# Patient Record
Sex: Male | Born: 1965 | Race: Black or African American | Hispanic: No | Marital: Married | Smoking: Never smoker
Health system: Southern US, Community
[De-identification: ages and names within clinical notes are randomized; demographics above are authoritative.]

## PROBLEM LIST (undated history)

## (undated) DIAGNOSIS — I10 Essential (primary) hypertension: Secondary | ICD-10-CM

## (undated) DIAGNOSIS — H539 Unspecified visual disturbance: Secondary | ICD-10-CM

## (undated) DIAGNOSIS — N200 Calculus of kidney: Secondary | ICD-10-CM

## (undated) DIAGNOSIS — E7800 Pure hypercholesterolemia, unspecified: Secondary | ICD-10-CM

## (undated) HISTORY — DX: Calculus of kidney: N20.0

## (undated) HISTORY — DX: Unspecified visual disturbance: H53.9

## (undated) HISTORY — DX: Essential (primary) hypertension: I10

## (undated) HISTORY — DX: Pure hypercholesterolemia, unspecified: E78.00

## (undated) HISTORY — PX: FINGER AMPUTATION: SHX636

## (undated) HISTORY — PX: HERNIA REPAIR: SHX51

## (undated) HISTORY — PX: SKIN GRAFT: SHX250

---

## 2015-09-25 ENCOUNTER — Encounter: Payer: Self-pay | Admitting: Gastroenterology

## 2015-10-25 ENCOUNTER — Other Ambulatory Visit (HOSPITAL_COMMUNITY): Payer: Self-pay | Admitting: Internal Medicine

## 2015-10-25 ENCOUNTER — Other Ambulatory Visit: Payer: Self-pay | Admitting: Internal Medicine

## 2015-10-25 DIAGNOSIS — R1011 Right upper quadrant pain: Secondary | ICD-10-CM

## 2015-10-26 ENCOUNTER — Ambulatory Visit (HOSPITAL_COMMUNITY)
Admission: RE | Admit: 2015-10-26 | Discharge: 2015-10-26 | Disposition: A | Discharge status: Home / Self Care | Payer: 59 | Source: Ambulatory Visit | Attending: Internal Medicine | Admitting: Internal Medicine

## 2015-10-26 DIAGNOSIS — R1011 Right upper quadrant pain: Secondary | ICD-10-CM | POA: Insufficient documentation

## 2015-11-13 ENCOUNTER — Ambulatory Visit (AMBULATORY_SURGERY_CENTER): Payer: Self-pay

## 2015-11-13 VITALS — Ht 73.0 in | Wt 257.0 lb

## 2015-11-13 DIAGNOSIS — Z1211 Encounter for screening for malignant neoplasm of colon: Secondary | ICD-10-CM

## 2015-11-13 NOTE — Progress Notes (Signed)
No allergies to eggs or soy No past problems with anesthesia No home oxygen No diet/weight loss meds  Has email and internet; registered for emmi 

## 2015-11-26 ENCOUNTER — Telehealth: Payer: Self-pay | Admitting: Internal Medicine

## 2015-11-26 MED ORDER — SUPREP BOWEL PREP KIT 17.5-3.13-1.6 GM/177ML PO SOLN: ORAL | Status: DC

## 2015-11-26 NOTE — Telephone Encounter (Signed)
Needed bowel prep

## 2015-11-27 ENCOUNTER — Encounter: Payer: Self-pay | Admitting: Gastroenterology

## 2015-11-27 ENCOUNTER — Ambulatory Visit (AMBULATORY_SURGERY_CENTER): Payer: 59 | Admitting: Gastroenterology

## 2015-11-27 VITALS — BP 105/60 | HR 67 | Temp 98.7°F | Resp 15 | Ht 73.0 in | Wt 257.0 lb

## 2015-11-27 DIAGNOSIS — Z1211 Encounter for screening for malignant neoplasm of colon: Secondary | ICD-10-CM

## 2015-11-27 MED ORDER — SODIUM CHLORIDE 0.9 % IV SOLN: 500.0000 mL | INTRAVENOUS | Status: DC

## 2015-11-27 NOTE — Patient Instructions (Signed)
YOU HAD AN ENDOSCOPIC PROCEDURE TODAY AT THE [facility]:   Refer to the procedure report that was given to you for any specific questions about what was found during the examination.  If the procedure report does not answer your questions, please call your gastroenterologist to clarify.  If you requested that your care partner not be given the details of your procedure findings, then the procedure report has been included in a sealed envelope for you to review at your convenience later.  YOU SHOULD EXPECT: Some feelings of bloating in the abdomen. Passage of more gas than usual.  Walking can help get rid of the air that was put into your GI tract during the procedure and reduce the bloating. If you had a lower endoscopy (such as a colonoscopy or flexible sigmoidoscopy) you may notice spotting of blood in your stool or on the toilet paper. If you underwent a bowel prep for your procedure, you may not have a normal bowel movement for a few days.  Please Note:  You might notice some irritation and congestion in your nose or some drainage.  This is from the oxygen used during your procedure.  There is no need for concern and it should clear up in a day or so.  SYMPTOMS TO REPORT IMMEDIATELY:   Following lower endoscopy (colonoscopy or flexible sigmoidoscopy):  Excessive amounts of blood in the stool  Significant tenderness or worsening of abdominal pains  Swelling of the abdomen that is new, acute  Fever of 100F or higher   For urgent or emergent issues, a gastroenterologist can be reached at any hour by calling [phone].   DIET: Your first meal following the procedure should be a small meal and then it is ok to progress to your normal diet. Heavy or fried foods are harder to digest and may make you feel nauseous or bloated.  Likewise, meals heavy in dairy and vegetables can increase bloating.  Drink plenty of fluids but you should avoid alcoholic beverages for 24  hours.  ACTIVITY:  You should plan to take it easy for the rest of today and you should NOT DRIVE or use heavy machinery until tomorrow (because of the sedation medicines used during the test).    FOLLOW UP: Our staff will call the number listed on your records the next business day following your procedure to check on you and address any questions or concerns that you may have regarding the information given to you following your procedure. If we do not reach you, we will leave a message.  However, if you are feeling well and you are not experiencing any problems, there is no need to return our call.  We will assume that you have returned to your regular daily activities without incident.  If any biopsies were taken you will be contacted by phone or by letter within the next 1-3 weeks.  Please call us at [phone] if you have not heard about the biopsies in 3 weeks.    SIGNATURES/CONFIDENTIALITY: You and/or your care partner have signed paperwork which will be entered into your electronic medical record.  These signatures attest to the fact that that the information above on your After Visit Summary has been reviewed and is understood.  Full responsibility of the confidentiality of this discharge information lies with you and/or your care-partner. 

## 2015-11-27 NOTE — Progress Notes (Signed)
Patient awakening,vss,report to rn 

## 2015-11-27 NOTE — Op Note (Signed)
[facility] [address] [place], [id number]   COLONOSCOPY PROCEDURE REPORT  PATIENT: Andrew Dean, [place]  MR#: [id number] BIRTHDATE: 02/04/1966 , 49  yrs. old GENDER: male ENDOSCOPIST: Rachael Feeaniel P Sonali Wivell, MD REFERRED XB:[id number]:Richard Jacky KindleAronson, M.D. PROCEDURE DATE:  11/27/2015 PROCEDURE:   Colonoscopy, screening First Screening Colonoscopy - Avg.  risk and is 50 yrs.  old or older Yes.  Prior Negative Screening - Now for repeat screening. N/A  History of Adenoma - Now for follow-up colonoscopy & has been > or = to 3 yrs.  N/A  Recommend repeat exam, <10 yrs? No ASA CLASS:   Class II INDICATIONS:Screening for colonic neoplasia and Colorectal Neoplasm Risk Assessment for this procedure is average risk. MEDICATIONS: Monitored anesthesia care and Propofol 250 mg IV  DESCRIPTION OF PROCEDURE:   After the risks benefits and alternatives of the procedure were thoroughly explained, informed consent was obtained.  The digital rectal exam revealed no abnormalities of the rectum.   The LB [id number]  endoscope was introduced through the anus and advanced to the cecum, which was identified by both the appendix and ileocecal valve. No adverse events experienced.   The quality of the prep was excellent.  The instrument was then slowly withdrawn as the colon was fully examined. Estimated blood loss is zero unless otherwise noted in this procedure report.   COLON FINDINGS: A normal appearing cecum, ileocecal valve, and appendiceal orifice were identified.  The ascending, transverse, descending, sigmoid colon, and rectum appeared unremarkable. Retroflexed views revealed no abnormalities. The time to cecum = 1.3 Withdrawal time = 6.5   The scope was withdrawn and the procedure completed. COMPLICATIONS: There were no immediate complications.  ENDOSCOPIC IMPRESSION: Normal colonoscopy  RECOMMENDATIONS: You should continue to follow colorectal cancer screening guidelines for  "routine risk" patients with a repeat colonoscopy in 10 years. There is no need for other screening tests prior to then (including FOBT, stool testing).  eSigned:  Rachael Feeaniel P Lilianna Case, MD 11/27/2015 9:30 AM

## 2015-11-28 ENCOUNTER — Telehealth: Payer: Self-pay | Admitting: *Deleted

## 2015-11-28 NOTE — Telephone Encounter (Signed)
  Follow up Call-  Call back number 11/27/2015  Post procedure Call Back phone  # [phone]  Permission to leave phone message Yes     Patient questions:  Do you have a fever, pain , or abdominal swelling? No. Pain Score  0 *  Have you tolerated food without any problems? Yes.    Have you been able to return to your normal activities? Yes.    Do you have any questions about your discharge instructions: Diet   No. Medications  No. Follow up visit  No.  Do you have questions or concerns about your Care? No.  Actions: * If pain score is 4 or above: No action needed, pain <4.

## 2017-04-15 IMAGING — US US ABDOMEN LIMITED
1 series · 14 of 25 positions shown · non-contrast
Comparison: None.

CLINICAL DATA: One month history of right upper quadrant pain

EXAM:
US ABDOMEN LIMITED - RIGHT UPPER QUADRANT

[Series 1: us abdomen limited · 0.21mm/px · 14 of 65 slices shown]
[im 1/65]
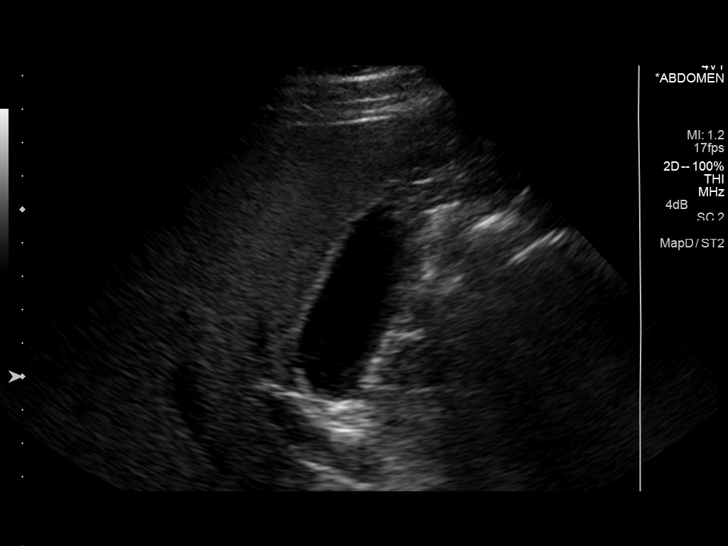
[im 6/65]
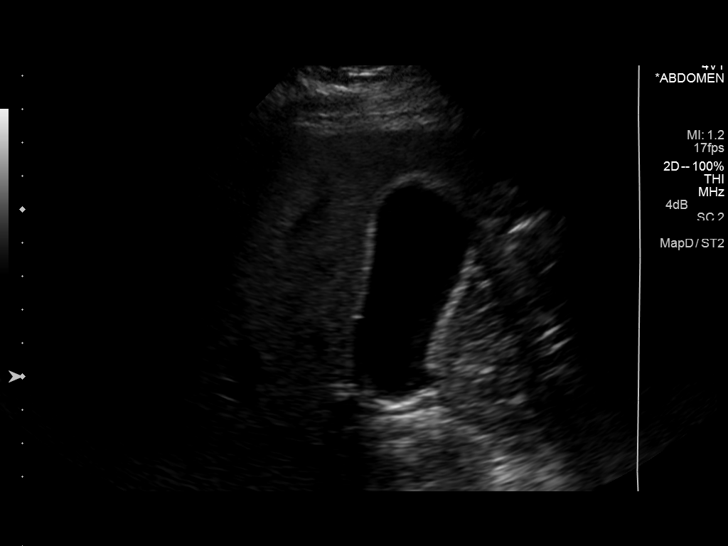
[im 11/65]
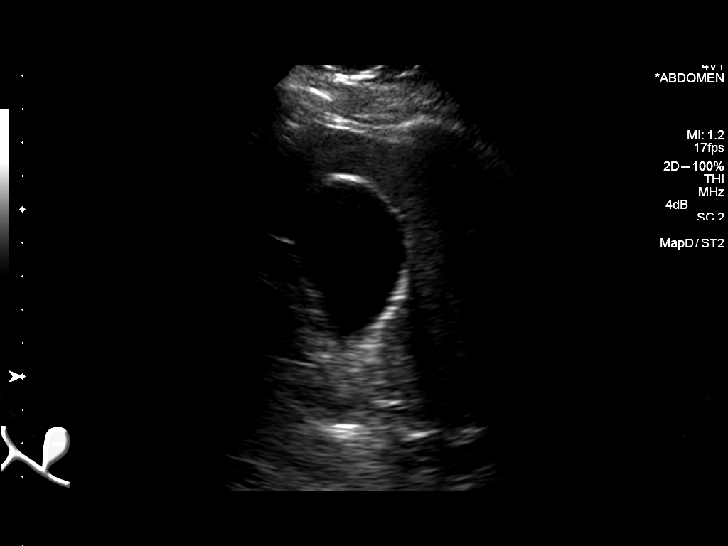
[im 17/65]
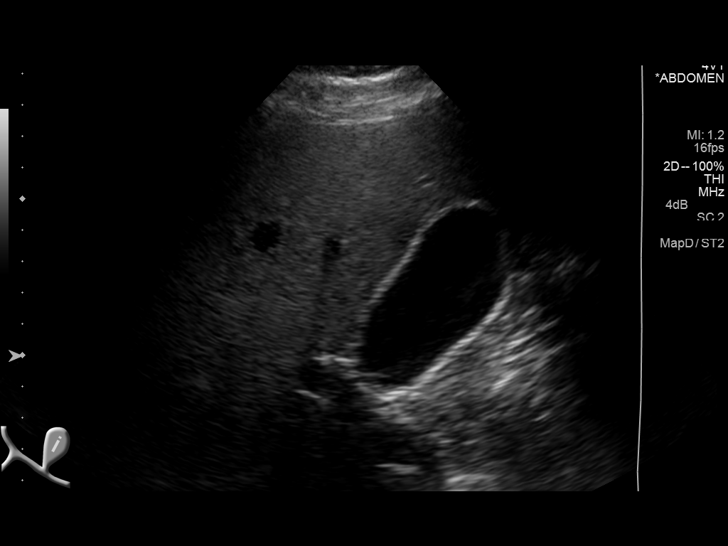
[im 22/65]
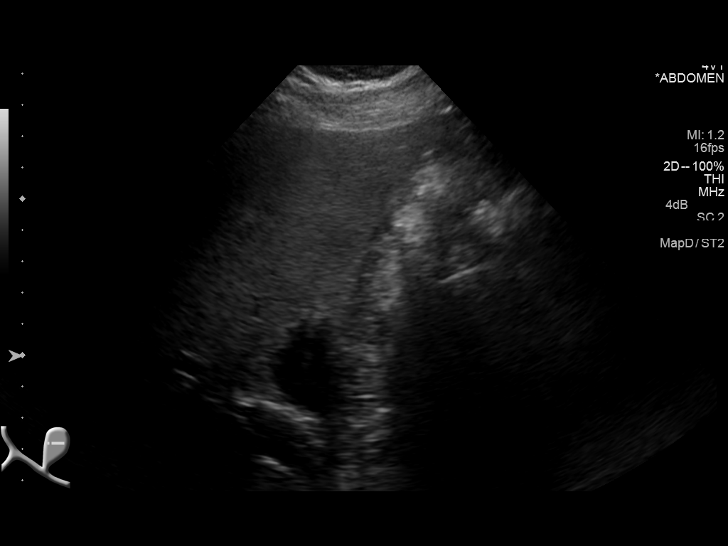
[im 25/65]
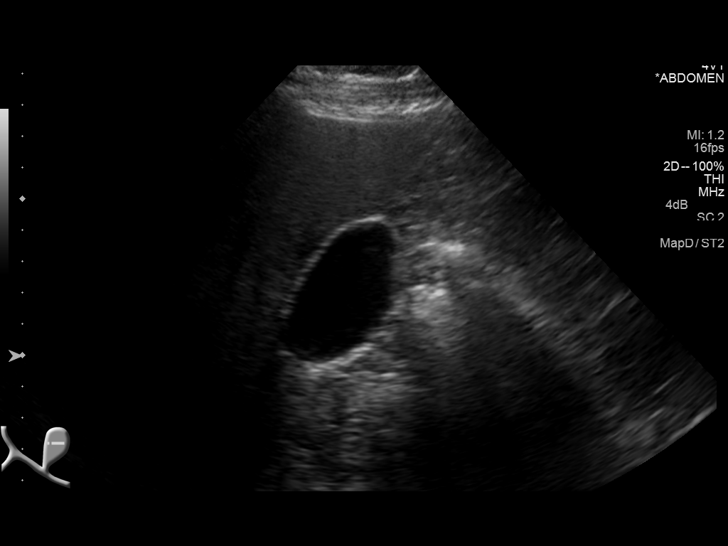
[im 30/65]
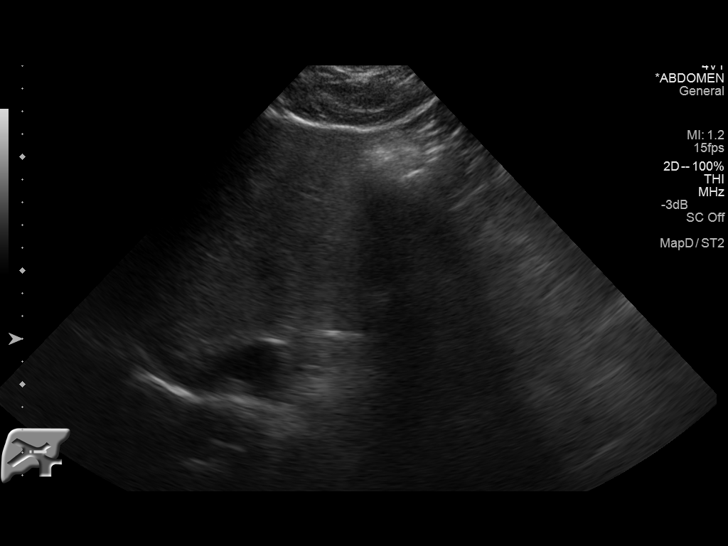
[im 35/65]
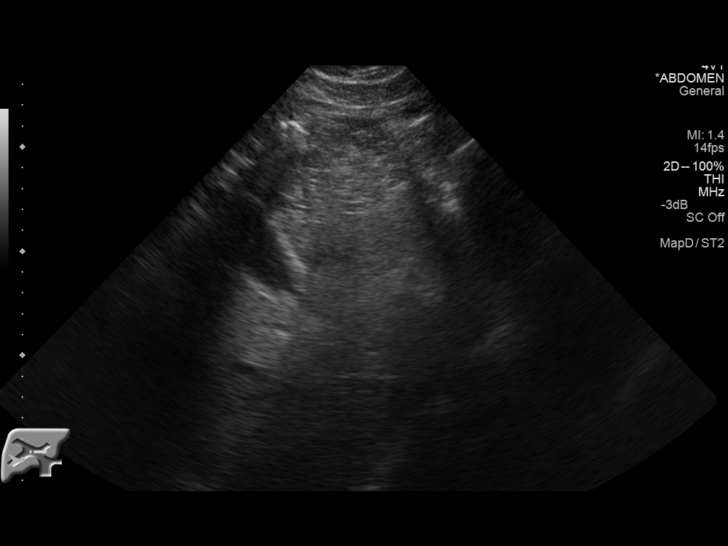
[im 41/65]
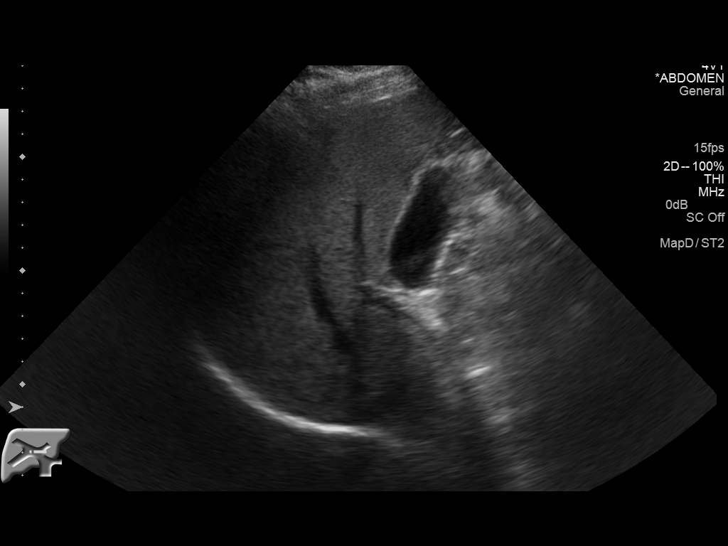
[im 43/65]
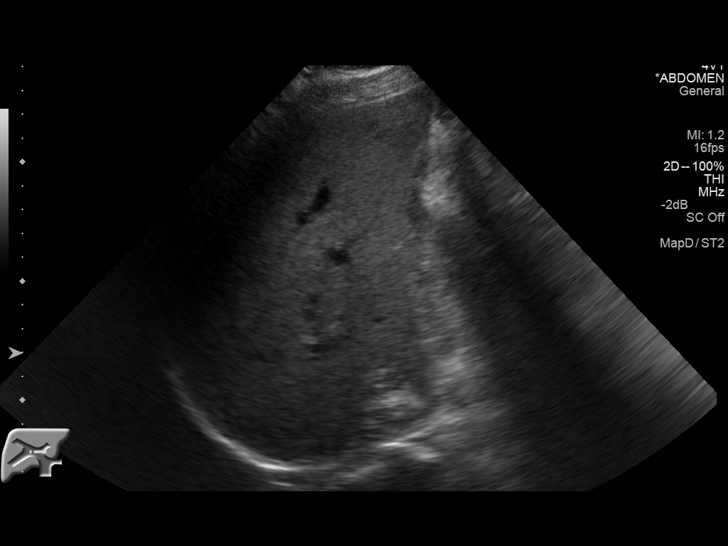
[im 49/65]
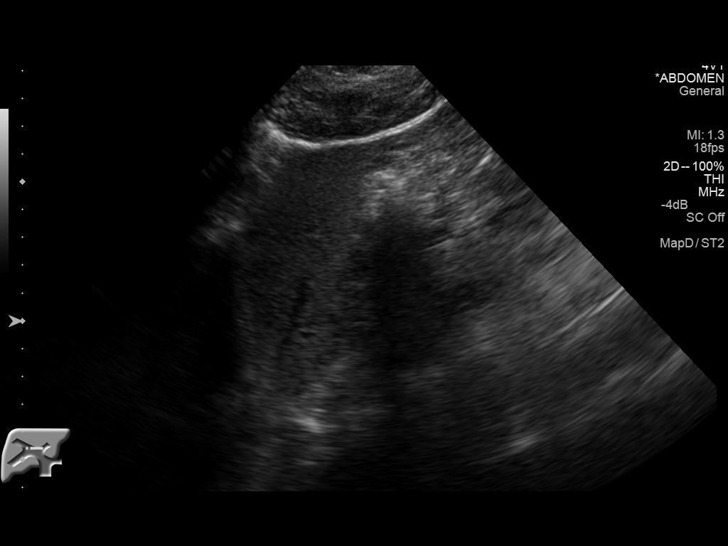
[im 54/65]
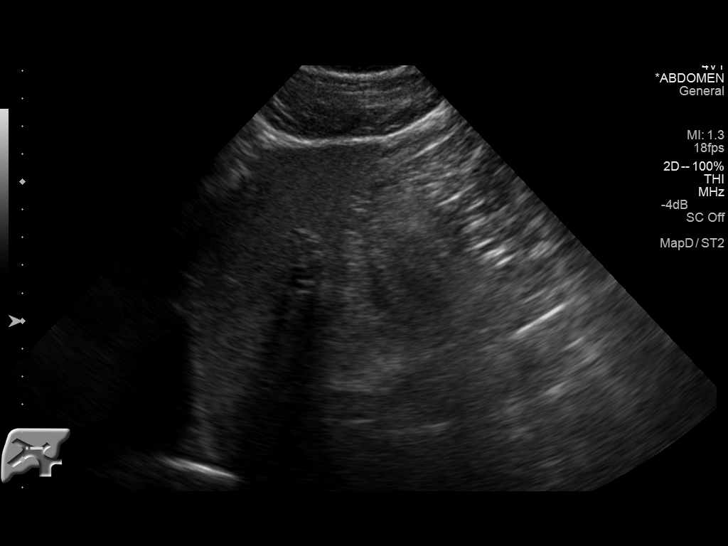
[im 59/65]
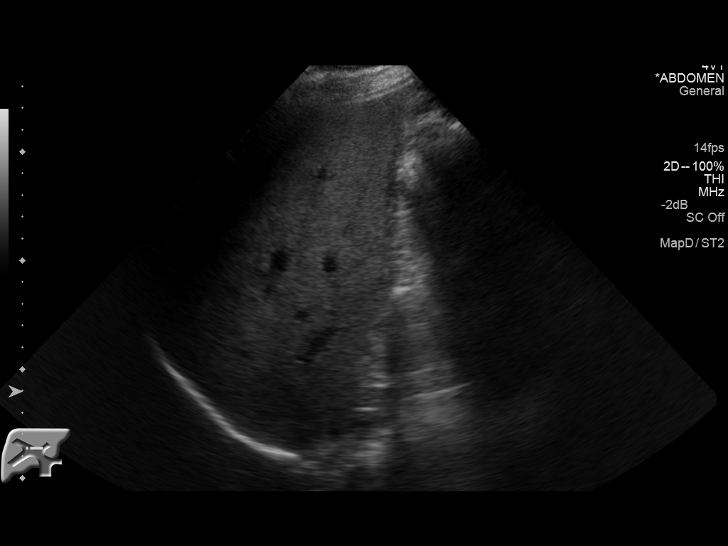
[im 65/65]
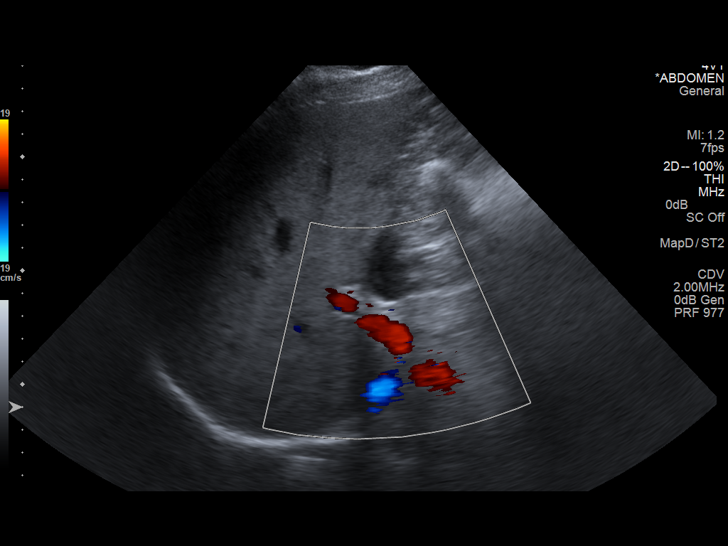

[14 of 25 positions shown; findings below may reference images not displayed]

FINDINGS: Gallbladder:

No gallstones or wall thickening visualized. There is no
pericholecystic fluid. No sonographic Murphy sign noted.

Common bile duct:

Diameter: 2 mm. There is no intrahepatic or extrahepatic biliary
duct dilatation.

Liver:

No focal lesion identified. Within normal limits in parenchymal
echogenicity.
IMPRESSION: Study within normal limits.

## 2017-04-17 DIAGNOSIS — E784 Other hyperlipidemia: Secondary | ICD-10-CM | POA: Diagnosis not present

## 2017-04-17 DIAGNOSIS — I10 Essential (primary) hypertension: Secondary | ICD-10-CM | POA: Diagnosis not present

## 2017-05-19 DIAGNOSIS — M25579 Pain in unspecified ankle and joints of unspecified foot: Secondary | ICD-10-CM | POA: Diagnosis not present

## 2017-05-19 DIAGNOSIS — M7661 Achilles tendinitis, right leg: Secondary | ICD-10-CM | POA: Diagnosis not present

## 2017-06-04 DIAGNOSIS — M7661 Achilles tendinitis, right leg: Secondary | ICD-10-CM | POA: Diagnosis not present

## 2017-10-14 DIAGNOSIS — Z0000 Encounter for general adult medical examination without abnormal findings: Secondary | ICD-10-CM | POA: Diagnosis not present

## 2017-10-21 DIAGNOSIS — Z0000 Encounter for general adult medical examination without abnormal findings: Secondary | ICD-10-CM | POA: Diagnosis not present

## 2017-10-21 DIAGNOSIS — Z23 Encounter for immunization: Secondary | ICD-10-CM | POA: Diagnosis not present

## 2017-10-21 DIAGNOSIS — E7849 Other hyperlipidemia: Secondary | ICD-10-CM | POA: Diagnosis not present

## 2017-10-21 DIAGNOSIS — G542 Cervical root disorders, not elsewhere classified: Secondary | ICD-10-CM | POA: Diagnosis not present

## 2018-06-30 DIAGNOSIS — G542 Cervical root disorders, not elsewhere classified: Secondary | ICD-10-CM | POA: Diagnosis not present

## 2018-06-30 DIAGNOSIS — E7849 Other hyperlipidemia: Secondary | ICD-10-CM | POA: Diagnosis not present

## 2018-06-30 DIAGNOSIS — I10 Essential (primary) hypertension: Secondary | ICD-10-CM | POA: Diagnosis not present

## 2018-08-02 DIAGNOSIS — R200 Anesthesia of skin: Secondary | ICD-10-CM | POA: Diagnosis not present

## 2018-08-02 DIAGNOSIS — R202 Paresthesia of skin: Secondary | ICD-10-CM | POA: Diagnosis not present

## 2018-08-02 DIAGNOSIS — R292 Abnormal reflex: Secondary | ICD-10-CM | POA: Diagnosis not present

## 2018-08-12 ENCOUNTER — Telehealth: Payer: Self-pay | Admitting: Neurology

## 2018-08-12 ENCOUNTER — Encounter: Payer: Self-pay | Admitting: Neurology

## 2018-08-12 ENCOUNTER — Ambulatory Visit: Payer: 59 | Admitting: Neurology

## 2018-08-12 ENCOUNTER — Other Ambulatory Visit: Payer: Self-pay

## 2018-08-12 VITALS — BP 139/86 | HR 69 | Resp 18 | Ht 73.0 in | Wt 258.0 lb

## 2018-08-12 DIAGNOSIS — R29898 Other symptoms and signs involving the musculoskeletal system: Secondary | ICD-10-CM | POA: Diagnosis not present

## 2018-08-12 DIAGNOSIS — G959 Disease of spinal cord, unspecified: Secondary | ICD-10-CM

## 2018-08-12 DIAGNOSIS — M5440 Lumbago with sciatica, unspecified side: Secondary | ICD-10-CM

## 2018-08-12 DIAGNOSIS — R29818 Other symptoms and signs involving the nervous system: Secondary | ICD-10-CM

## 2018-08-12 DIAGNOSIS — R200 Anesthesia of skin: Secondary | ICD-10-CM | POA: Diagnosis not present

## 2018-08-12 NOTE — Progress Notes (Signed)
[facility]  PATIENT: Andrew Dean DOB: 06-05-1966  REFERRING DOCTOR OR PCP: Geoffry Paradiseichard Aronson SOURCE: Patient, notes from Dr. Jacky KindleAronson, laboratory reports  _________________________________   HISTORICAL  CHIEF COMPLAINT:  Chief Complaint  Patient presents with  . Numbness    Intermittent left sided neck pain for yrs.  Numbness bilat index, middle and ring fingers onset 3-4 wks. ago.  Also onset 3-4 weeks ago of lbp radiating down outside of both legs to toes, only when he sits up straight or leans his head backwards/fim    HISTORY OF PRESENT ILLNESS:  I had the pleasure seeing your patient, Andrew ManHarry Bertha, [facility] for neurologic consultation regarding his numbness and hyperreflexia.  About 3-4 weeks ago, he was coughing a lot at night (mucus stuck in throat) .  When he woke up he had a lot of numbness in his arms, trunk  and legs.   He sat on the side of the bed.  There was no weakness or facial numbness .  Over a few minutes the numbness in the legs and trunk improved but he still had numbness in his hands.     He got out of bed and gait was normal.     Since then, he has had numbness in the fingertips.  He notes no change in coordination, writing or typing.      About 2 months ago, he had aching in the thenar eminences of both hands and this would sometimes wake him up at night.     He has had ED the last 4 months and Viagra was started but has not helped.    Over the last 2 months, he has noted mild urinary frequency and urgency and had urge incontinence (leakage).    He does not note any change in gait, stair climbing and coordination.      He does not have neck pain the last 2 months but has had neck pain and stiffness hte past 2 years.    He notes a Lhermitte sign with far extension of the neck.    Hee also reports back pain and sometimes pain going into the legs as an electric shock.  This is increased recently.     Laboratory  reports were reviewed.  CBC and TSH were essentially normal.    He has benign essential hypertension and hyperlipidemia and is otherwise healthy.   No h/o cancer.    No preceding viral illness or vaccination.     REVIEW OF SYSTEMS: Constitutional: No fevers, chills, sweats, or change in appetite Eyes: No visual changes, double vision, eye pain Ear, nose and throat: No hearing loss, ear pain, nasal congestion, sore throat Cardiovascular: No chest pain, palpitations Respiratory: No shortness of breath at rest or with exertion.   No wheezes GastrointestinaI: No nausea, vomiting, diarrhea, abdominal pain, fecal incontinence Genitourinary: recent urgency and rare incontinence.   Nocturia x 2.   Musculoskeletal: No neck pain, back pain Integumentary: No rash, pruritus, skin lesions Neurological: as above Psychiatric: No depression at this time.  No anxiety Endocrine: No palpitations, diaphoresis, change in appetite, change in weigh or increased thirst Hematologic/Lymphatic: No anemia, purpura, petechiae. Allergic/Immunologic: No itchy/runny eyes, nasal congestion, recent allergic reactions, rashes  ALLERGIES: No Known Allergies  HOME MEDICATIONS:  Current Outpatient Medications:  .  atorvastatin (LIPITOR) 20 MG tablet, Take 20 mg by mouth daily., Disp: , Rfl:  .  losartan (COZAAR) 50 MG tablet, Take 50 mg by mouth daily., Disp: , Rfl:  .  meloxicam (MOBIC) 15 MG tablet, TK 1 T PO QD WF FOR INFLAMMATION, Disp: , Rfl: 0 .  VIAGRA 100 MG tablet, TK 1/2 TO 1 T PO 1 HOUR PRIOR TO SEXUAL ACTIVITY, Disp: , Rfl: 11  PAST MEDICAL HISTORY: Past Medical History:  Diagnosis Date  . High cholesterol   . Hypertension   . Hypertension   . Vision abnormalities     PAST SURGICAL HISTORY: Past Surgical History:  Procedure Laterality Date  . FINGER AMPUTATION     middle amputation  . HERNIA REPAIR    . SKIN GRAFT      FAMILY HISTORY: Family History  Problem Relation Age of Onset  .  Pancreatic cancer Mother   . Hypertension Father   . Diabetes type II Father   . Healthy Sister   . Healthy Sister   . Healthy Sister   . Colon cancer Neg Hx     SOCIAL HISTORY:  Social History   Socioeconomic History  . Marital status: Married    Spouse name: Not on file  . Number of children: Not on file  . Years of education: Not on file  . Highest education level: Not on file  Occupational History  . Not on file  Social Needs  . Financial resource strain: Not on file  . Food insecurity:    Worry: Not on file    Inability: Not on file  . Transportation needs:    Medical: Not on file    Non-medical: Not on file  Tobacco Use  . Smoking status: Never Smoker  . Smokeless tobacco: Never Used  Substance and Sexual Activity  . Alcohol use: Yes    Alcohol/week: 3.0 standard drinks    Types: 3 Shots of liquor per week  . Drug use: No  . Sexual activity: Not on file  Lifestyle  . Physical activity:    Days per week: Not on file    Minutes per session: Not on file  . Stress: Not on file  Relationships  . Social connections:    Talks on phone: Not on file    Gets together: Not on file    Attends religious service: Not on file    Active member of club or organization: Not on file    Attends meetings of clubs or organizations: Not on file    Relationship status: Not on file  . Intimate partner violence:    Fear of current or ex partner: Not on file    Emotionally abused: Not on file    Physically abused: Not on file    Forced sexual activity: Not on file  Other Topics Concern  . Not on file  Social History Narrative  . Not on file     PHYSICAL EXAM  Vitals:   08/12/18 1037  BP: 139/86  Pulse: 69  Resp: 18  Weight: 258 lb (117 kg)  Height: 6\' 1"  (1.854 m)    Body mass index is 34.04 kg/m.   General: The patient is well-developed and well-nourished and in no acute distress  Neck: The neck is supple, no carotid bruits are noted.  The neck is  nontender.  Cardiovascular: The heart has a regular rate and rhythm with a normal S1 and S2. There were no murmurs, gallops or rubs.   Skin: Extremities are without rash or edema.  Musculoskeletal:  Back is nontender  Neurologic Exam  Mental status: The patient is alert and oriented x 3 at the time of the examination. The  patient has apparent normal recent and remote memory, with an apparently normal attention span and concentration ability.   Speech is normal.  Cranial nerves: Extraocular movements are full.  Facial symmetry is present. There is good facial sensation to soft touch bilaterally.Facial strength is normal.  Trapezius and sternocleidomastoid strength is normal. No dysarthria is noted.  The tongue is midline, and the patient has symmetric elevation of the soft palate. No obvious hearing deficits are noted.  Motor:  Muscle bulk is normal.   Tone is normal. Strength is 4+/5 in the intrinsic hand muscles.  Strength is normal elsewhere.  Sensory: Sensory testing is intact to pinprick, soft touch and vibration sensation in all 4 extremities.  Coordination: Cerebellar testing reveals good finger-nose-finger and heel-to-shin bilaterally.  Gait and station: Station is normal.   Gait is normal. Tandem gait is normal. Romberg is negative.   Reflexes: Deep tendon reflexes are 2 at the biceps, 3 at the triceps, right greater than left, 4 at the knees with crossed abductor responses.  There is some nonsustained clonus on the right and an increased ankle reflex on the left without clonus.   Plantar responses are flexor.     ASSESSMENT AND PLAN  Cervical myelopathy (HCC)  Numbness  Lhermitte's sign positive  Hand weakness  Disease of spinal cord (HCC) - Plan: MR CERVICAL SPINE W WO CONTRAST  Midline low back pain with sciatica, sciatica laterality unspecified, unspecified chronicity   In summary, Mr. Creta Levin is a 52 year old man with numbness, hand weakness,Lhermitte sign,  urinary urgency, ED and back pain.  Sometimes he gets an electric shock going down his back and even into the legs.     He has hyperreflexia and weakness of the intrinsic hand muscles on examination.  I am most concerned that he has a cervical myelopathy if there is an intrinsic transverse myelitis or an extrinsic compression from disc or degenerative disease.   We need to check an MRI of the cervical spine with and without contrast.   If he does have a extrinsic compressive myelopathy, I will have him see neurosurgery for further evaluation and possible surgery.     Has some back pain and he has some radiating electric like dysesthesias.   We will check an MRI of the lumbar spine without contrast.  Currently, he does not feel that the dysesthetic pain is severe enough to warrant going on a medication such as gabapentin.  We also discussed treatment for the urinary urgency and we will consider going on the medication if this worsens.  We will call him with the results of the MRIs and follow-up as needed based on the results.  He should call us if he has new or worsening neurologic symptoms.    Quy Lotts A. Epimenio Foot, MD, [facility] 08/12/2018, 10:41 AM Certified in Neurology, Clinical Neurophysiology, Sleep Medicine, Pain Medicine and Neuroimaging  [facility] [address] [place], [place] [address] [phone]

## 2018-08-12 NOTE — Telephone Encounter (Signed)
Dr. Epimenio FootSater came to me today about scheduling this patient's MRI within the next few days. [facility] and [facility] pre [facility] requirements, no pre cert is required. I called GI and spoke with Colin MuldersBrianna, she has openings this weekend and is going to call and schedule the patient now.

## 2018-08-12 NOTE — Telephone Encounter (Signed)
Pt. did c/o persistent lower back pain, minimal relief with otc meds, Meloxicam. Per V/O RAS, ok for MRI L-spine without contrast.  Order placed in Epic/fim

## 2018-08-12 NOTE — Telephone Encounter (Signed)
I called the patient to make him aware that we placed the order for the second MRI and he could call GI back and schedule. DW

## 2018-08-12 NOTE — Telephone Encounter (Signed)
Colin MuldersBrianna called back and stated that the patient also wanted to have a Lumbar MRI at the same time and would like to know if we could put this in. Please advise.

## 2018-08-16 ENCOUNTER — Telehealth: Payer: Self-pay | Admitting: Neurology

## 2018-08-16 NOTE — Telephone Encounter (Signed)
[employer] Auth: [employer] via [employer] website.  Patient is scheduled at GI for 08/24/18.

## 2018-08-24 ENCOUNTER — Inpatient Hospital Stay: Admission: RE | Admit: 2018-08-24 | Payer: 59 | Source: Ambulatory Visit

## 2018-09-19 ENCOUNTER — Ambulatory Visit
Admission: RE | Admit: 2018-09-19 | Discharge: 2018-09-19 | Disposition: A | Discharge status: Home / Self Care | Payer: 59 | Source: Ambulatory Visit | Attending: Neurology | Admitting: Neurology

## 2018-09-19 DIAGNOSIS — M545 Low back pain: Secondary | ICD-10-CM | POA: Diagnosis not present

## 2018-09-19 DIAGNOSIS — G959 Disease of spinal cord, unspecified: Secondary | ICD-10-CM

## 2018-09-19 DIAGNOSIS — M5440 Lumbago with sciatica, unspecified side: Secondary | ICD-10-CM | POA: Diagnosis not present

## 2018-09-19 MED ORDER — GADOBENATE DIMEGLUMINE 529 MG/ML IV SOLN
20.0000 mL | Freq: Once | INTRAVENOUS | Status: AC | PRN
  Administered 2018-09-19: 20 mL via INTRAVENOUS

## 2018-09-20 ENCOUNTER — Telehealth: Payer: Self-pay | Admitting: Neurology

## 2018-09-20 DIAGNOSIS — G959 Disease of spinal cord, unspecified: Secondary | ICD-10-CM

## 2018-09-20 DIAGNOSIS — M48061 Spinal stenosis, lumbar region without neurogenic claudication: Secondary | ICD-10-CM

## 2018-09-20 DIAGNOSIS — R29818 Other symptoms and signs involving the nervous system: Secondary | ICD-10-CM

## 2018-09-20 NOTE — Telephone Encounter (Signed)
I spoke with Andrew Dean about the cervical and lumbar MRIs.  They are both very abnormal.  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  There are myelopathic signal changes within the spinal cord.  There could be C5 nerve root compression on the left.  In the lumbar spine, he also has severe spinal stenosis at L4-L5 due to mild degenerative changes and epidural lipoomatosis and at L5-S1 due to a central disc herniation and epidural lipomatosis could also affect the S1 nerve roots.  Due to these changes, I would like him to see neurosurgery and we will set up an appointment for him.

## 2018-09-29 DIAGNOSIS — M50221 Other cervical disc displacement at C4-C5 level: Secondary | ICD-10-CM | POA: Diagnosis not present

## 2018-10-06 DIAGNOSIS — M5020 Other cervical disc displacement, unspecified cervical region: Secondary | ICD-10-CM | POA: Diagnosis not present

## 2018-10-06 DIAGNOSIS — M50021 Cervical disc disorder at C4-C5 level with myelopathy: Secondary | ICD-10-CM | POA: Diagnosis not present

## 2018-10-06 DIAGNOSIS — M4802 Spinal stenosis, cervical region: Secondary | ICD-10-CM | POA: Diagnosis not present

## 2018-10-28 DIAGNOSIS — R82998 Other abnormal findings in urine: Secondary | ICD-10-CM | POA: Diagnosis not present

## 2018-10-28 DIAGNOSIS — E7849 Other hyperlipidemia: Secondary | ICD-10-CM | POA: Diagnosis not present

## 2018-10-28 DIAGNOSIS — Z125 Encounter for screening for malignant neoplasm of prostate: Secondary | ICD-10-CM | POA: Diagnosis not present

## 2018-11-04 DIAGNOSIS — E7849 Other hyperlipidemia: Secondary | ICD-10-CM | POA: Diagnosis not present

## 2018-11-04 DIAGNOSIS — Z0000 Encounter for general adult medical examination without abnormal findings: Secondary | ICD-10-CM | POA: Diagnosis not present

## 2018-11-04 DIAGNOSIS — Z23 Encounter for immunization: Secondary | ICD-10-CM | POA: Diagnosis not present

## 2018-11-04 DIAGNOSIS — I10 Essential (primary) hypertension: Secondary | ICD-10-CM | POA: Diagnosis not present

## 2018-11-04 DIAGNOSIS — R7301 Impaired fasting glucose: Secondary | ICD-10-CM | POA: Diagnosis not present

## 2018-11-10 DIAGNOSIS — M50221 Other cervical disc displacement at C4-C5 level: Secondary | ICD-10-CM | POA: Diagnosis not present

## 2018-12-08 DIAGNOSIS — M50221 Other cervical disc displacement at C4-C5 level: Secondary | ICD-10-CM | POA: Diagnosis not present

## 2021-08-14 ENCOUNTER — Other Ambulatory Visit: Payer: Self-pay | Admitting: Internal Medicine

## 2021-08-14 ENCOUNTER — Ambulatory Visit
Admission: RE | Admit: 2021-08-14 | Discharge: 2021-08-14 | Disposition: A | Discharge status: Home / Self Care | Payer: 59 | Source: Ambulatory Visit | Attending: Internal Medicine | Admitting: Internal Medicine

## 2021-08-14 DIAGNOSIS — R109 Unspecified abdominal pain: Secondary | ICD-10-CM

## 2021-08-14 DIAGNOSIS — R319 Hematuria, unspecified: Secondary | ICD-10-CM

## 2022-12-31 ENCOUNTER — Emergency Department (HOSPITAL_COMMUNITY): Payer: 59

## 2022-12-31 ENCOUNTER — Ambulatory Visit (INDEPENDENT_AMBULATORY_CARE_PROVIDER_SITE_OTHER): Payer: 59

## 2022-12-31 ENCOUNTER — Ambulatory Visit: Payer: 59 | Admitting: Podiatry

## 2022-12-31 ENCOUNTER — Emergency Department (HOSPITAL_COMMUNITY)
Admission: EM | Admit: 2022-12-31 | Discharge: 2023-01-01 | Disposition: A | Discharge status: Home / Self Care | Payer: 59 | Attending: Emergency Medicine | Admitting: Emergency Medicine

## 2022-12-31 ENCOUNTER — Other Ambulatory Visit: Payer: Self-pay

## 2022-12-31 ENCOUNTER — Encounter (HOSPITAL_COMMUNITY): Payer: Self-pay

## 2022-12-31 VITALS — BP 167/109 | HR 69

## 2022-12-31 DIAGNOSIS — M778 Other enthesopathies, not elsewhere classified: Secondary | ICD-10-CM

## 2022-12-31 DIAGNOSIS — M79671 Pain in right foot: Secondary | ICD-10-CM

## 2022-12-31 DIAGNOSIS — M79672 Pain in left foot: Secondary | ICD-10-CM | POA: Diagnosis not present

## 2022-12-31 DIAGNOSIS — Z79899 Other long term (current) drug therapy: Secondary | ICD-10-CM | POA: Diagnosis not present

## 2022-12-31 DIAGNOSIS — N201 Calculus of ureter: Secondary | ICD-10-CM

## 2022-12-31 DIAGNOSIS — I10 Essential (primary) hypertension: Secondary | ICD-10-CM | POA: Diagnosis not present

## 2022-12-31 DIAGNOSIS — R1012 Left upper quadrant pain: Secondary | ICD-10-CM | POA: Diagnosis present

## 2022-12-31 DIAGNOSIS — N132 Hydronephrosis with renal and ureteral calculous obstruction: Secondary | ICD-10-CM | POA: Insufficient documentation

## 2022-12-31 LAB — COMPREHENSIVE METABOLIC PANEL
ALT: 32 U/L (ref 0–44)
AST: 30 U/L (ref 15–41)
Albumin: 4.1 g/dL (ref 3.5–5.0)
Alkaline Phosphatase: 45 U/L (ref 38–126)
Anion gap: 9 (ref 5–15)
BUN: 19 mg/dL (ref 6–20)
CO2: 25 mmol/L (ref 22–32)
Calcium: 8.8 mg/dL — ABNORMAL LOW (ref 8.9–10.3)
Chloride: 102 mmol/L (ref 98–111)
Creatinine, Ser: 1.42 mg/dL — ABNORMAL HIGH (ref 0.61–1.24)
GFR, Estimated: 57 mL/min — ABNORMAL LOW (ref >60)
Glucose, Bld: 112 mg/dL — ABNORMAL HIGH (ref 70–99)
Potassium: 3.7 mmol/L (ref 3.5–5.1)
Sodium: 136 mmol/L (ref 135–145)
Total Bilirubin: 0.9 mg/dL (ref 0.3–1.2)
Total Protein: 7.3 g/dL (ref 6.5–8.1)

## 2022-12-31 LAB — URINALYSIS, ROUTINE W REFLEX MICROSCOPIC
Bacteria, UA: NONE SEEN
Bilirubin Urine: NEGATIVE
Glucose, UA: NEGATIVE mg/dL
Ketones, ur: NEGATIVE mg/dL
Leukocytes,Ua: NEGATIVE
Nitrite: NEGATIVE
Protein, ur: NEGATIVE mg/dL
Specific Gravity, Urine: 1.02 (ref 1.005–1.030)
pH: 5 (ref 5.0–8.0)

## 2022-12-31 LAB — CBC WITH DIFFERENTIAL/PLATELET
Abs Immature Granulocytes: 0.03 10*3/uL (ref 0.00–0.07)
Basophils Absolute: 0 10*3/uL (ref 0.0–0.1)
Basophils Relative: 0 %
Eosinophils Absolute: 0.1 10*3/uL (ref 0.0–0.5)
Eosinophils Relative: 1 %
HCT: 40.7 % (ref 39.0–52.0)
Hemoglobin: 14.5 g/dL (ref 13.0–17.0)
Immature Granulocytes: 0 %
Lymphocytes Relative: 16 %
Lymphs Abs: 1.2 10*3/uL (ref 0.7–4.0)
MCH: 28 pg (ref 26.0–34.0)
MCHC: 35.6 g/dL (ref 30.0–36.0)
MCV: 78.6 fL — ABNORMAL LOW (ref 80.0–100.0)
Monocytes Absolute: 0.6 10*3/uL (ref 0.1–1.0)
Monocytes Relative: 7 %
Neutro Abs: 5.6 10*3/uL (ref 1.7–7.7)
Neutrophils Relative %: 76 %
Platelets: 186 10*3/uL (ref 150–400)
RBC: 5.18 MIL/uL (ref 4.22–5.81)
RDW: 13 % (ref 11.5–15.5)
WBC: 7.5 10*3/uL (ref 4.0–10.5)
nRBC: 0 % (ref 0.0–0.2)

## 2022-12-31 LAB — LIPASE, BLOOD: Lipase: 36 U/L (ref 11–51)

## 2022-12-31 MED ORDER — MELOXICAM 15 MG PO TABS: 15.0000 mg | ORAL_TABLET | Freq: Every day | ORAL | 1 refills | Status: DC

## 2022-12-31 MED ORDER — IOHEXOL 300 MG/ML  SOLN
100.0000 mL | Freq: Once | INTRAMUSCULAR | Status: AC | PRN
  Administered 2022-12-31: 100 mL via INTRAVENOUS

## 2022-12-31 NOTE — Progress Notes (Signed)
   Chief Complaint  Patient presents with   Foot Pain    Bilateral foot pain, possible plantar fasciitis, heel and lateral side of the foot, X-Rays done today    HPI: 57 y.o. male presenting today as a new patient for evaluation of intermittent bilateral foot pain to the lateral aspect of the bilateral feet.  Patient states that this has been off and on for several months.  Denies a history of injury.  He does take ibuprofen as needed.  He has tried different work boots with minimal improvement.  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  He says that this week he has no pain or tenderness to his feet  Past Medical History:  Diagnosis Date   High cholesterol    Hypertension    Hypertension    Vision abnormalities     Past Surgical History:  Procedure Laterality Date   FINGER AMPUTATION     middle amputation   HERNIA REPAIR     SKIN GRAFT      No Known Allergies   Physical Exam: General: The patient is alert and oriented x3 in no acute distress.  Dermatology: Skin is warm, dry and supple bilateral lower extremities. Negative for open lesions or macerations.  Vascular: Palpable pedal pulses bilaterally. Capillary refill within normal limits.  Negative for any significant edema or erythema  Neurological: Light touch and protective threshold grossly intact  Musculoskeletal Exam: No pedal deformities noted  Radiographic Exam B/L feet 12/31/2022:  Normal osseous mineralization. Joint spaces preserved. No fracture/dislocation/boney destruction.  Os peroneum noted as well as additional accessory ossicles throughout the foot.  Assessment: 1.  Capsulitis bilateral feet; currently asymptomatic.  Intermittent. -Patient evaluated.  X-rays reviewed -Prescription for meloxicam 15 mg daily to take for acute flareups -Appointment with orthotics department for custom molded orthotics to support the medial longitudinal arch of the foot and offload pressure from the  midfoot -Return to clinic as needed with me      Edrick Kins, DPM [facility]  Dr. Edrick Kins, DPM    [address], [place] [address]                Office [phone]  Fax [phone]

## 2022-12-31 NOTE — ED Provider Triage Note (Signed)
Emergency Medicine Provider Triage Evaluation Note  Opie Maclaughlin , a 57 y.o. male  was evaluated in triage.  Pt complains of flank pain and abdominal pain. Patient reports he has had 3 episodes of left flank pain that radiates to his lower abdomen with associated nausea, vomiting since New Year's Eve.  Today he also developed chest pain that started around 7 PM.  No fever, bowel changes, urinary symptoms.  Review of Systems  Positive: As above Negative: As above  Physical Exam  BP (!) 178/102   Pulse 79   Temp 98.4 F (36.9 C)   Resp 14   Ht 6\' 1"  (1.854 m)   Wt 112 kg   SpO2 99%   BMI 32.59 kg/m  Gen:   Awake, no distress   Resp:  Normal effort  MSK:   Moves extremities without difficulty  Other:  Tenderness to palpation to left flank and lower abdomen.  Medical Decision Making  Medically screening exam initiated at 9:05 PM.  Appropriate orders placed.  Kaz Auld was informed that the remainder of the evaluation will be completed by another provider, this initial triage assessment does not replace that evaluation, and the importance of remaining in the ED until their evaluation is complete.     Rex Kras, [place] 01/01/23 1829

## 2022-12-31 NOTE — ED Triage Notes (Signed)
Pt reports with left flank pain that goes into his lower abdomen and into his chest. Pt states that the first occurrence happened before new years eve but the pain came back today.

## 2023-01-01 ENCOUNTER — Other Ambulatory Visit: Payer: Self-pay | Admitting: Urology

## 2023-01-01 MED ORDER — ONDANSETRON 4 MG PO TBDP: 4.0000 mg | ORAL_TABLET | Freq: Three times a day (TID) | ORAL | 0 refills | Status: AC | PRN

## 2023-01-01 MED ORDER — SODIUM CHLORIDE 0.9 % IV BOLUS
1000.0000 mL | Freq: Once | INTRAVENOUS | Status: AC
  Administered 2023-01-01: 1000 mL via INTRAVENOUS

## 2023-01-01 MED ORDER — TAMSULOSIN HCL 0.4 MG PO CAPS: 0.4000 mg | ORAL_CAPSULE | Freq: Every day | ORAL | 0 refills | Status: DC

## 2023-01-01 MED ORDER — OXYCODONE HCL 5 MG PO TABS: 2.5000 mg | ORAL_TABLET | Freq: Four times a day (QID) | ORAL | 0 refills | Status: DC | PRN

## 2023-01-01 MED ORDER — HYDROMORPHONE HCL 1 MG/ML IJ SOLN
0.5000 mg | Freq: Once | INTRAMUSCULAR | Status: AC
  Administered 2023-01-01: 0.5 mg via INTRAVENOUS
  Filled 2023-01-01: qty 1

## 2023-01-01 MED ORDER — TAMSULOSIN HCL 0.4 MG PO CAPS
0.4000 mg | ORAL_CAPSULE | Freq: Once | ORAL | Status: AC
  Administered 2023-01-01: 0.4 mg via ORAL
  Filled 2023-01-01: qty 1

## 2023-01-01 MED ORDER — KETOROLAC TROMETHAMINE 15 MG/ML IJ SOLN
15.0000 mg | Freq: Once | INTRAMUSCULAR | Status: AC
  Administered 2023-01-01: 15 mg via INTRAVENOUS
  Filled 2023-01-01: qty 1

## 2023-01-01 NOTE — ED Notes (Signed)
Pt reports sharp sudden onset of left sided flank pain that started at 1700 last night.  Denies blood in urine or burning w/ urination.

## 2023-01-01 NOTE — Discharge Instructions (Addendum)
For pain control you may take 600-800 mg of Ibuprofen every 8 hours as needed.  In addition you can take 0.5 to 1 tablet of Oxycodone every 6 hours as needed for pain not controlled with Ibuprofen

## 2023-01-01 NOTE — ED Provider Notes (Signed)
[facility] DEPT Provider Note  CSN: [id number] Arrival date & time: 12/31/22 2012  Chief Complaint(s) Abdominal Pain and Flank Pain  HPI Andrew Dean is a 57 y.o. male     Abdominal Pain Pain location:  LUQ and L flank Pain quality: aching and cramping   Pain severity:  Moderate Onset quality:  Sudden Duration: several weeks. Timing:  Intermittent (constant since 5p today) Progression:  Waxing and waning Relieved by:  Nothing Associated symptoms: no dysuria, no nausea, no shortness of breath and no vomiting   Flank Pain Associated symptoms include abdominal pain. Pertinent negatives include no shortness of breath.    Past Medical History Past Medical History:  Diagnosis Date   High cholesterol    Hypertension    Hypertension    Vision abnormalities    Patient Active Problem List   Diagnosis Date Noted   Numbness 08/12/2018   Lhermitte's sign positive 08/12/2018   Hand weakness 08/12/2018   Cervical myelopathy ([place]) 08/12/2018   Home Medication(s) Prior to Admission medications   Medication Sig Start Date End Date Taking? Authorizing Provider  ondansetron (ZOFRAN-ODT) 4 MG disintegrating tablet Take 1 tablet (4 mg total) by mouth every 8 (eight) hours as needed for up to 3 days for nausea or vomiting. 01/01/23 01/04/23 Yes Kell Ferris, Grayce Sessions, MD  oxyCODONE (ROXICODONE) 5 MG immediate release tablet Take 0.5-1 tablets (2.5-5 mg total) by mouth every 6 (six) hours as needed for up to 5 days for severe pain. 01/01/23 01/06/23 Yes Alayasia Breeding, Grayce Sessions, MD  tamsulosin (FLOMAX) 0.4 MG CAPS capsule Take 1 capsule (0.4 mg total) by mouth daily for 5 days. 01/01/23 01/06/23 Yes Taressa Rauh, Grayce Sessions, MD  atorvastatin (LIPITOR) 20 MG tablet Take 20 mg by mouth daily.    [provider]  losartan (COZAAR) 50 MG tablet Take 50 mg by mouth daily.    [provider]  meloxicam (MOBIC) 15 MG tablet Take 1 tablet (15 mg total) by  mouth daily. 12/31/22   Edrick Kins, DPM  VIAGRA 100 MG tablet TK 1/2 TO 1 T PO 1 HOUR PRIOR TO SEXUAL ACTIVITY 11/13/15   [provider]                                                                                                                                    Allergies Patient has no known allergies.  Review of Systems Review of Systems  Respiratory:  Negative for shortness of breath.   Gastrointestinal:  Positive for abdominal pain. Negative for nausea and vomiting.  Genitourinary:  Positive for flank pain. Negative for dysuria.   As noted in HPI  Physical Exam Vital Signs  I have reviewed the triage vital signs BP (!) 155/100   Pulse 74   Temp 97.7 F (36.5 C) (Oral)   Resp 17   Ht 6\' 1"  (1.854 m)   Wt 112 kg   SpO2  96%   BMI 32.59 kg/m   Physical Exam Vitals reviewed.  Constitutional:      General: He is not in acute distress.    Appearance: He is well-developed. He is not diaphoretic.  HENT:     Head: Normocephalic and atraumatic.     Right Ear: External ear normal.     Left Ear: External ear normal.     Nose: Nose normal.     Mouth/Throat:     Mouth: Mucous membranes are moist.  Eyes:     General: No scleral icterus.    Conjunctiva/sclera: Conjunctivae normal.  Neck:     Trachea: Phonation normal.  Cardiovascular:     Rate and Rhythm: Normal rate and regular rhythm.  Pulmonary:     Effort: Pulmonary effort is normal. No respiratory distress.     Breath sounds: No stridor.  Abdominal:     General: There is no distension.     Tenderness: There is no abdominal tenderness.  Musculoskeletal:        General: Normal range of motion.     Cervical back: Normal range of motion.  Neurological:     Mental Status: He is alert and oriented to person, place, and time.  Psychiatric:        Behavior: Behavior normal.     ED Results and Treatments Labs (all labs ordered are listed, but only abnormal results are displayed) Labs Reviewed  CBC  WITH DIFFERENTIAL/PLATELET - Abnormal; Notable for the following components:      Result Value   MCV 78.6 (*)    All other components within normal limits  COMPREHENSIVE METABOLIC PANEL - Abnormal; Notable for the following components:   Glucose, Bld 112 (*)    Creatinine, Ser 1.42 (*)    Calcium 8.8 (*)    GFR, Estimated 57 (*)    All other components within normal limits  URINALYSIS, ROUTINE W REFLEX MICROSCOPIC - Abnormal; Notable for the following components:   Hgb urine dipstick MODERATE (*)    All other components within normal limits  LIPASE, BLOOD                                                                                                                         EKG  EKG Interpretation  Date/Time:  Wednesday December 31 2022 20:33:09 EST Ventricular Rate:  73 PR Interval:  181 QRS Duration: 92 QT Interval:  376 QTC Calculation: 415 R Axis:   15 Text Interpretation: Sinus rhythm Abnormal R-wave progression, early transition No old tracing to compare Confirmed by Addison Lank [phone]) on 01/01/2023 3:50:11 AM       Radiology CT ABDOMEN PELVIS W CONTRAST  Result Date: 12/31/2022 CLINICAL DATA:  Left lower quadrant abdominal pain, left flank pain EXAM: CT ABDOMEN AND PELVIS WITH CONTRAST TECHNIQUE: Multidetector CT imaging of the abdomen and pelvis was performed using the standard protocol following bolus administration of intravenous contrast. RADIATION DOSE REDUCTION: This exam was performed according to the departmental  dose-optimization program which includes automated exposure control, adjustment of the mA and/or kV according to patient size and/or use of iterative reconstruction technique. CONTRAST:  OMNIPAQUE IOHEXOL 300 MG/ML  SOLN COMPARISON:  08/14/2021 FINDINGS: Lower chest: Minimal nodular ground-glass infiltrate within the subpleural visualized left lower lobe is nonspecific, possibly infectious or inflammatory. The visualized lung bases otherwise clear.  Visualized heart and pericardium are unremarkable. Hepatobiliary: Mild hepatic steatosis. No enhancing intrahepatic mass. No intra or extrahepatic biliary ductal dilation. Gallbladder unremarkable. Pancreas: Unremarkable Spleen: Unremarkable Adrenals/Urinary Tract: The adrenal glands are unremarkable. The kidneys are normal in size and position. There is mild left hydronephrosis secondary to an obstructing 9 x 11 mm calculus within the proximal left ureter at the level the superior endplate of L4. Mildly delayed left renal cortical enhancement. Scattered 2 mm nonobstructing calculi are noted within the right kidney. No hydronephrosis on the right. No perinephric fluid collections. No enhancing intrarenal masses. The bladder is unremarkable. Stomach/Bowel: Stomach is within normal limits. Appendix appears normal. No evidence of bowel wall thickening, distention, or inflammatory changes. Vascular/Lymphatic: Moderate aortoiliac atherosclerotic calcification. No aortic aneurysm. No pathologic adenopathy within the abdomen and pelvis. Reproductive: Mild prostatic hypertrophy. Other: Tiny fat containing umbilical hernia. Musculoskeletal: No acute bone abnormality. No lytic or blastic bone lesion. Degenerative changes are seen within the lumbar spine. IMPRESSION: 1. Obstructing 9 x 11 mm calculus within the proximal left ureter resulting in mild left hydronephrosis. 2. Minimal right nonobstructing nephrolithiasis. 3. Mild hepatic steatosis. Aortic Atherosclerosis (ICD10-I70.0). Electronically Signed   By: Helyn Numbers M.D.   On: 12/31/2022 22:59    Medications Ordered in ED Medications  iohexol (OMNIPAQUE) 300 MG/ML solution 100 mL (100 mLs Intravenous Contrast Given 12/31/22 2236)  sodium chloride 0.9 % bolus 1,000 mL (1,000 mLs Intravenous New Bag/Given 01/01/23 0236)  ketorolac (TORADOL) 15 MG/ML injection 15 mg (15 mg Intravenous Given 01/01/23 0240)  HYDROmorphone (DILAUDID) injection 0.5 mg (0.5 mg Intravenous  Given 01/01/23 0237)  tamsulosin (FLOMAX) capsule 0.4 mg (0.4 mg Oral Given 01/01/23 0241)                                                                                                                                     Procedures Procedures  (including critical care time)  Medical Decision Making / ED Course   Medical Decision Making Amount and/or Complexity of Data Reviewed Labs: ordered. Decision-making details documented in ED Course. Radiology: ordered and independent interpretation performed. Decision-making details documented in ED Course. ECG/medicine tests: ordered and independent interpretation performed. Decision-making details documented in ED Course.  Risk Prescription drug management. Parenteral controlled substances. Decision regarding hospitalization.    Left flank pain. DDX: Includes but not limited to renal colic, diverticulitis/other intra-abdominal inflammatory/infectious process, bowel obstruction, pyelonephritis.   Work up consistent with large ureteral stone with mild hydronephrosis seen on CT scan.  No other intra-abdominal inflammatory/infectious process. UA without evidence of infection CBC  without leukocytosis or anemia Metabolic panel without significant electrolyte derangements.  Mild renal insufficiency without evidence of AKI.  No bili obstruction pancreatitis.  Patient provided with IV fluids, IV pain medicine including Toradol and Dilaudid as well as Flomax.  Patient reported significant relief.  Given his significant improvement, patient does not require admission to the hospital.  Recommended outpatient urology follow-up.   Final Clinical Impression(s) / ED Diagnoses Final diagnoses:  Left ureteral stone   The patient appears reasonably screened and/or stabilized for discharge and I doubt any other medical condition or other [facility] requiring further screening, evaluation, or treatment in the ED at this time. I have discussed the findings, Dx and  Tx plan with the patient/family who expressed understanding and agree(s) with the plan. Discharge instructions discussed at length. The patient/family was given strict return precautions who verbalized understanding of the instructions. No further questions at time of discharge.  Disposition: Discharge  Condition: Good  ED Discharge Orders          Ordered    tamsulosin (FLOMAX) 0.4 MG CAPS capsule  Daily        01/01/23 0409    ondansetron (ZOFRAN-ODT) 4 MG disintegrating tablet  Every 8 hours PRN        01/01/23 0409    oxyCODONE (ROXICODONE) 5 MG immediate release tablet  Every 6 hours PRN        01/01/23 0409            [facility] narcotic database reviewed and no active prescriptions noted.   Follow Up: Geoffry Paradise, MD [address] [place] [address] [phone]  Call  to schedule an appointment for close follow up  Heloise Purpura, MD [address] [place] [address] [phone]   to schedule an appointment for close follow up           This chart was dictated using voice recognition software.  Despite best efforts to proofread,  errors can occur which can change the documentation meaning.    Nira Conn, MD 01/01/23 [phone]

## 2023-01-02 ENCOUNTER — Encounter (HOSPITAL_BASED_OUTPATIENT_CLINIC_OR_DEPARTMENT_OTHER): Payer: Self-pay | Admitting: Urology

## 2023-01-02 NOTE — Progress Notes (Signed)
   01/02/23 1719  Pre-op Phone Call  Surgery Date Verified 01/05/23  Arrival Time Verified 0645  Surgery Location Verified [facility]  Medical History Reviewed Yes  Is the patient taking a GLP-1 receptor agonist? No  Does the patient have diabetes? No diagnosis of diabetes  Do you have a history of heart problems?  --   Patient educated about smoking cessation 24 hours prior to surgery. N/A Non-Smoker  Med Rec Completed Yes  NPO (Including gum & candy) After midnight  Responsible adult to drive and be with you for 24 hours? Yes  Name & Phone Number for Ride/Caregiver Terri, spouse  Instructed to contact the location of procedure/ provider if they or anyone in their household develops symptoms or tests positive for COVID-19, has close contact with someone who tests positive for COVID, or has known exposure to any contagious illness. Yes  Call this number the morning of surgery  with any problems that may cancel your surgery. [phone]  Covid-19 Assessment  Have you had a positive COVID-19 test within the previous 90 days? No  COVID Testing Guidance Proceed with the additional questions.  Patient's surgery required a COVID-19 test (cardiothoracic, complex ENT, and bronchoscopies/ EBUS) No  Have you been unmasked and in close contact with anyone with COVID-19 or COVID-19 symptoms within the past 10 days? No  Do you or anyone in your household currently have any COVID-19 symptoms? No   Received and reviewed blue folder. Pt aware to holds NSAIDs for 48h prior and no alcohol use 24h prior to procedure.

## 2023-01-02 NOTE — Progress Notes (Signed)
Left message for pt requesting call back to discuss ESWL.

## 2023-01-05 ENCOUNTER — Encounter (HOSPITAL_BASED_OUTPATIENT_CLINIC_OR_DEPARTMENT_OTHER): Payer: Self-pay | Admitting: Urology

## 2023-01-05 ENCOUNTER — Ambulatory Visit (HOSPITAL_BASED_OUTPATIENT_CLINIC_OR_DEPARTMENT_OTHER)
Admission: RE | Admit: 2023-01-05 | Discharge: 2023-01-05 | Disposition: A | Discharge status: Home / Self Care | Payer: 59 | Attending: Urology | Admitting: Urology

## 2023-01-05 ENCOUNTER — Encounter (HOSPITAL_BASED_OUTPATIENT_CLINIC_OR_DEPARTMENT_OTHER)
Admission: RE | Disposition: A | Discharge status: Home / Self Care | Payer: Self-pay | Source: Home / Self Care | Attending: Urology

## 2023-01-05 ENCOUNTER — Ambulatory Visit (HOSPITAL_COMMUNITY): Payer: 59

## 2023-01-05 DIAGNOSIS — N132 Hydronephrosis with renal and ureteral calculous obstruction: Secondary | ICD-10-CM | POA: Diagnosis present

## 2023-01-05 DIAGNOSIS — I10 Essential (primary) hypertension: Secondary | ICD-10-CM | POA: Diagnosis not present

## 2023-01-05 DIAGNOSIS — N200 Calculus of kidney: Secondary | ICD-10-CM

## 2023-01-05 HISTORY — PX: EXTRACORPOREAL SHOCK WAVE LITHOTRIPSY: SHX1557

## 2023-01-05 SURGERY — LITHOTRIPSY, ESWL
Anesthesia: LOCAL | Laterality: Left

## 2023-01-05 MED ORDER — SODIUM CHLORIDE 0.9 % IV SOLN: INTRAVENOUS | Status: DC

## 2023-01-05 MED ORDER — DIPHENHYDRAMINE HCL 25 MG PO CAPS
ORAL_CAPSULE | ORAL | Status: AC
  Filled 2023-01-05: qty 1

## 2023-01-05 MED ORDER — CIPROFLOXACIN HCL 500 MG PO TABS
ORAL_TABLET | ORAL | Status: AC
  Filled 2023-01-05: qty 1

## 2023-01-05 MED ORDER — TAMSULOSIN HCL 0.4 MG PO CAPS: 0.4000 mg | ORAL_CAPSULE | Freq: Every day | ORAL | 0 refills | Status: AC

## 2023-01-05 MED ORDER — CIPROFLOXACIN HCL 500 MG PO TABS
500.0000 mg | ORAL_TABLET | ORAL | Status: AC
  Administered 2023-01-05: 500 mg via ORAL

## 2023-01-05 MED ORDER — OXYCODONE HCL 5 MG PO TABS: 5.0000 mg | ORAL_TABLET | ORAL | Status: DC | PRN

## 2023-01-05 MED ORDER — DIPHENHYDRAMINE HCL 25 MG PO CAPS
25.0000 mg | ORAL_CAPSULE | ORAL | Status: AC
  Administered 2023-01-05: 25 mg via ORAL

## 2023-01-05 MED ORDER — DIAZEPAM 5 MG PO TABS
ORAL_TABLET | ORAL | Status: AC
  Filled 2023-01-05: qty 2

## 2023-01-05 MED ORDER — DIAZEPAM 5 MG PO TABS
10.0000 mg | ORAL_TABLET | ORAL | Status: AC
  Administered 2023-01-05: 10 mg via ORAL

## 2023-01-05 MED ORDER — OXYCODONE HCL 5 MG PO TABS: 2.5000 mg | ORAL_TABLET | Freq: Four times a day (QID) | ORAL | 0 refills | Status: AC | PRN

## 2023-01-05 NOTE — Op Note (Signed)
See [facility] OP note scanned into chart. Also because of the size, density, location and other factors that cannot be anticipated I feel this will likely be a staged procedure. This fact supersedes any indication in the scanned [facility] operative note to the contrary.  

## 2023-01-05 NOTE — H&P (Signed)
New pt -    1) left ureteral stone - he developed left flank pain and emesis. Started 12/18/2022. On and off. Pain comes and goes. A 01/01/2023 CT revealed a left 10 mm (visible, SSD 17 cm, HU 1400) proximal stone with mild hydro. A small 1 mm right randall's plaque type calcification. UA with no bacteria, 11-20 rbc. Cr 1.42, WBC 7.5. A prior Aug 2022 CT showed stone in the left renal pelvis or MP.   No prior stone passage or surgery. No blood thinners.   Today, seen for the above. Pain 2/10 today. KUB with a 12 mm left prox stone.   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     ALLERGIES: None   MEDICATIONS: Tamsulosin Hcl 0.4 mg capsule  Ondansetron Hcl     GU PSH: None     PSH Notes: neck fusion   NON-GU PSH: Neck Surgery (Unspecified) - 2021     GU PMH: Renal calculus    NON-GU PMH: Hypertension    FAMILY HISTORY: Diabetes - Father Kidney Stones - Father pancreatic cancer - Mother    Notes: No children   SOCIAL HISTORY: Marital Status: Married Preferred Language: English; Race: Black or African American Current Smoking Status: Patient has never smoked.   Tobacco Use Assessment Completed: Used Tobacco in last 30 days? Does drink.  Drinks 2 caffeinated drinks per day. 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    REVIEW OF SYSTEMS:    GU Review Male:   Patient denies frequent urination, hard to postpone urination, burning/ pain with urination, get up at night to urinate, leakage of urine, stream starts and stops, trouble starting your stream, have to strain to urinate , erection problems, and penile pain.  Gastrointestinal (Upper):   Patient reports nausea and vomiting. Patient denies indigestion/ heartburn.  Gastrointestinal (Lower):   Patient denies diarrhea and constipation.  Constitutional:   Patient denies fever, night sweats, weight loss, and fatigue.  Skin:   Patient denies skin rash/ lesion and itching.  Eyes:   Patient denies blurred vision  and double vision.  Ears/ Nose/ Throat:   Patient denies sore throat and sinus problems.  Hematologic/Lymphatic:   Patient denies swollen glands and easy bruising.  Cardiovascular:   Patient denies leg swelling and chest pains.  Respiratory:   Patient denies cough and shortness of breath.  Endocrine:   Patient denies excessive thirst.  Musculoskeletal:   Patient denies back pain and joint pain.  Neurological:   Patient denies headaches and dizziness.  Psychologic:   Patient denies depression and anxiety.   VITAL SIGNS:      01/01/2023 10:12 AM  Weight 247 lb / 112.04 kg  Height 73 in / 185.42 cm  BP 145/91 mmHg  Pulse 71 /min  Temperature 98.0 F / 36.6 C  BMI 32.6 kg/m   MULTI-SYSTEM PHYSICAL EXAMINATION:    Constitutional: Well-nourished. No physical deformities. Normally developed. Good grooming.  Neck: Neck symmetrical, not swollen. Normal tracheal position.  Respiratory: No labored breathing, no use of accessory muscles.   Cardiovascular: Normal temperature, normal extremity pulses, no swelling, no varicosities.  Lymphatic: No enlargement of neck, axillae, groin.  Skin: No paleness, no jaundice, no cyanosis. No lesion, no ulcer, no rash.  Neurologic / Psychiatric: Oriented to time, oriented to place, oriented to person. No depression, no anxiety, no agitation.  Gastrointestinal: No mass, no tenderness, no rigidity, non obese abdomen.  Eyes: Normal conjunctivae. Normal eyelids.  Ears, Nose, Mouth, and Throat:  Left ear no scars, no lesions, no masses. Right ear no scars, no lesions, no masses. Nose no scars, no lesions, no masses. Normal hearing. Normal lips.  Musculoskeletal: Normal gait and station of head and neck.     Complexity of Data:  Lab Test Review:   BUN/Creatinine  Records Review:   Previous Hospital Records  Urine Test Review:   Urinalysis  X-Ray Review: C.T. Abdomen/Pelvis: Reviewed Films. Discussed With Patient. 2024    PROCEDURES:         KUB - 74018  A  single view of the abdomen is obtained.  Calculi:  12 mm left prox stone       The bones appeared normal. The bowel gas pattern appeared normal. The soft tissues were unremarkable. . Patient confirmed No Neulasta OnPro Device.           Urinalysis - 81003 Dipstick Dipstick Cont'd  Color: Yellow Bilirubin: Neg mg/dL  Appearance: Clear Ketones: Neg mg/dL  Specific Gravity: 1.015 Blood: Neg ery/uL  pH: 6.0 Protein: Neg mg/dL  Glucose: Neg mg/dL Urobilinogen: 0.2 mg/dL    Nitrites: Neg    Leukocyte Esterase: Neg leu/uL    ASSESSMENT:      ICD-10 Details  1 GU:   Renal calculus - N20.0 Chronic, Stable  2   Ureteral calculus - N20.1 Undiagnosed New Problem - I discussed with the patient the nature risks and benefits of continued stone passage, off label use of alpha blockers, shockwave lithotripsy or ureteroscopy. All questions answered. He will proceed with ESWL.   Disc lower success with ESWL and possible need for staged procedure.      PLAN:           Orders X-Rays: KUB          Schedule Return Visit/Planned Activity: Next Available Appointment - Schedule Surgery

## 2023-01-05 NOTE — Discharge Instructions (Signed)
See [facility] discharge instructions in chart.  

## 2023-01-06 ENCOUNTER — Encounter (HOSPITAL_BASED_OUTPATIENT_CLINIC_OR_DEPARTMENT_OTHER): Payer: Self-pay | Admitting: Urology

## 2023-01-06 NOTE — Progress Notes (Deleted)
Pt no showed appt.

## 2023-01-07 ENCOUNTER — Other Ambulatory Visit: Payer: 59

## 2023-01-07 DIAGNOSIS — M778 Other enthesopathies, not elsewhere classified: Secondary | ICD-10-CM

## 2023-01-08 ENCOUNTER — Other Ambulatory Visit: Payer: Self-pay | Admitting: Podiatry

## 2023-01-08 DIAGNOSIS — M79672 Pain in left foot: Secondary | ICD-10-CM

## 2023-01-08 DIAGNOSIS — M778 Other enthesopathies, not elsewhere classified: Secondary | ICD-10-CM

## 2023-01-13 ENCOUNTER — Other Ambulatory Visit: Payer: Self-pay | Admitting: Podiatry

## 2023-01-13 DIAGNOSIS — M79671 Pain in right foot: Secondary | ICD-10-CM

## 2023-01-13 DIAGNOSIS — M778 Other enthesopathies, not elsewhere classified: Secondary | ICD-10-CM

## 2023-01-13 DIAGNOSIS — M79672 Pain in left foot: Secondary | ICD-10-CM

## 2023-01-28 ENCOUNTER — Ambulatory Visit: Payer: 59 | Admitting: Podiatry

## 2023-02-06 ENCOUNTER — Ambulatory Visit: Payer: 59 | Admitting: Podiatry

## 2023-02-06 DIAGNOSIS — M778 Other enthesopathies, not elsewhere classified: Secondary | ICD-10-CM

## 2023-02-06 NOTE — Progress Notes (Signed)
   Chief Complaint  Patient presents with   Capsulitis    RM 8 4 week follow up right foot pain. Pt states he is doing very well no pain since last visit. Pt still taking Meloxicam. No questions, concerns or new symptoms.     HPI: 57 y.o. male presenting today for follow-up evaluation of intermittent bilateral foot pain to the lateral aspect of the bilateral feet.  Patient states that today he has no pain or tenderness.  He says the meloxicam completely resolves his intermittent foot pain.  He is very satisfied today   Brief history patient states that this has been off and on for several months.  Denies a history of injury.  He does take ibuprofen as needed.  He has tried different work boots with minimal improvement.  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  He says that this week he has no pain or tenderness to his feet  Past Medical History:  Diagnosis Date   High cholesterol    Hypertension    Hypertension    Vision abnormalities     Past Surgical History:  Procedure Laterality Date   EXTRACORPOREAL SHOCK WAVE LITHOTRIPSY Left 01/05/2023   Procedure: LEFT EXTRACORPOREAL SHOCK WAVE LITHOTRIPSY (ESWL);  Surgeon: Ardis Hughs, MD;  Location: [facility];  Service: Urology;  Laterality: Left;   FINGER AMPUTATION     middle amputation   HERNIA REPAIR     SKIN GRAFT      No Known Allergies   Physical Exam: General: The patient is alert and oriented x3 in no acute distress.  Dermatology: Skin is warm, dry and supple bilateral lower extremities. Negative for open lesions or macerations.  Vascular: Palpable pedal pulses bilaterally. Capillary refill within normal limits.  Negative for any significant edema or erythema  Neurological: Light touch and protective threshold grossly intact  Musculoskeletal Exam: No pedal deformities noted  Radiographic Exam B/L feet 12/31/2022:  Normal osseous mineralization. Joint spaces preserved. No  fracture/dislocation/boney destruction.  Os peroneum noted as well as additional accessory ossicles throughout the foot.  Assessment: 1.  Capsulitis bilateral feet; currently asymptomatic.  Intermittent. -Patient evaluated.  -Continue meloxicam 15 mg daily as needed.  Patient states this completely resolves his pain with his feet -Patient has an appointment for custom molded orthotics pending.  Recommend that if the patient has no pain or symptoms to the feet I would not recommend custom orthotics for the moment. -Return to clinic as needed      Edrick Kins, DPM [facility]  Dr. Edrick Kins, DPM    [address], [place] [address]                Office [phone]  Fax [phone]

## 2023-02-24 ENCOUNTER — Other Ambulatory Visit: Payer: Self-pay | Admitting: Podiatry

## 2023-04-22 ENCOUNTER — Encounter: Payer: Self-pay | Admitting: Podiatry

## 2023-04-22 ENCOUNTER — Ambulatory Visit: Payer: 59 | Admitting: Podiatry

## 2023-04-22 DIAGNOSIS — L603 Nail dystrophy: Secondary | ICD-10-CM | POA: Diagnosis not present

## 2023-04-22 NOTE — Progress Notes (Signed)
  Subjective:  Patient ID: Andrew Dean, male    DOB: 01/12/1966,   MRN: [id number]  Chief Complaint  Patient presents with   Nail Problem    right great toenail red and nail coming off at the cuticle. Nail fungus. Patient is not diabetic.     57 y.o. male presents for concern of right great toenail coming off. Relates notices it a couple weeks ago. Does not recall any recent specific trauma but does wear steel toed boots. Relates the nail has been loose and was able to Dean off some of it. Denies any pain currently but was painful . Denies any other pedal complaints. Denies n/v/f/c.   Past Medical History:  Diagnosis Date   High cholesterol    Hypertension    Hypertension    Vision abnormalities     Objective:  Physical Exam: Vascular: DP/PT pulses 2/4 bilateral. CFT <3 seconds. Normal hair growth on digits. No edema.  Skin. No lacerations or abrasions bilateral feet. Right hallux nail dystrophic and lateral border detached from nail bed. Medial border still attached.  Musculoskeletal: MMT 5/5 bilateral lower extremities in DF, PF, Inversion and Eversion. Deceased ROM in DF of ankle joint.  Neurological: Sensation intact to light touch.   Assessment:   1. Onychodystrophy      Plan:  Patient was evaluated and treated and all questions answered. Discussed dystrophic toenails etiology and treatment options including procedure for removal vs conservative care.  Right hallux nail  was debrided back to attached nail portion and to patient comfort.  Advised to keep neosporin and bandaid on for now.  Return if any changes or pain and can consider nail removal.    Louann Sjogren, DPM

## 2023-06-24 ENCOUNTER — Other Ambulatory Visit: Payer: Self-pay | Admitting: Podiatry

## 2023-08-03 IMAGING — CT CT ABD-PELV W/O CM
2 of 4 series · 16 of 46 positions shown, 18 images · non-contrast
Comparison: None.

CLINICAL DATA: Left flank pain, hematuria

EXAM:
CT ABDOMEN AND PELVIS WITHOUT CONTRAST
TECHNIQUE: Multidetector CT imaging of the abdomen and pelvis was performed
following the standard protocol without IV contrast.

[Series 2: renal stone 5.00 br40 s3 axial · axial · 0.80mm/px · z∈[+1263,+1723]mm · 13 of 101 slices shown, 15 images]
[im 5/101  soft-tissue]
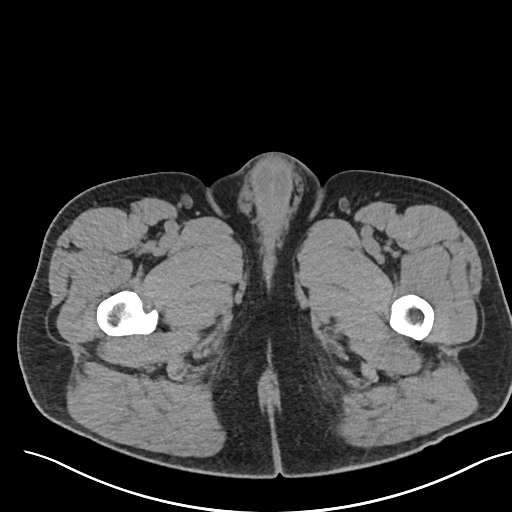
[im 5/101  bone]
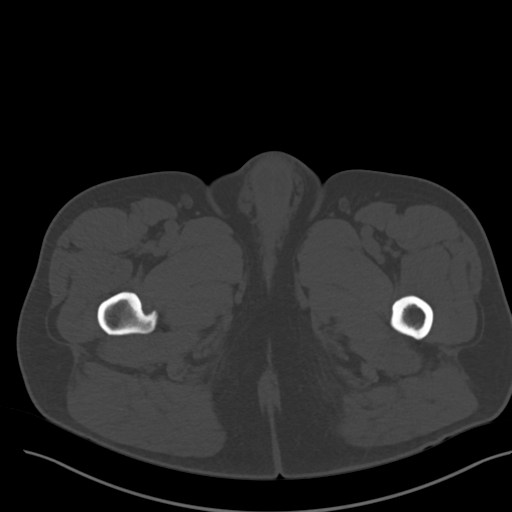
[im 13/101  soft-tissue]
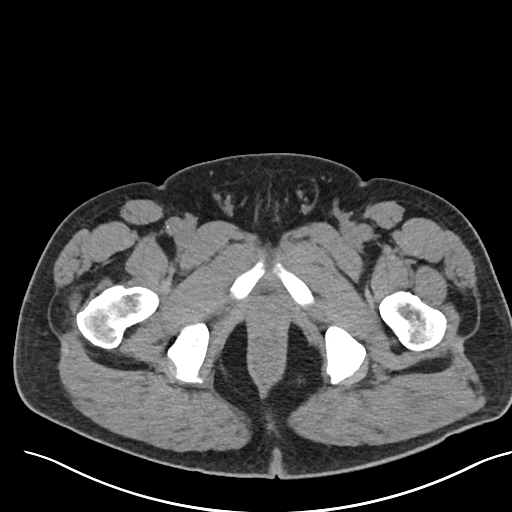
[im 21/101  soft-tissue]
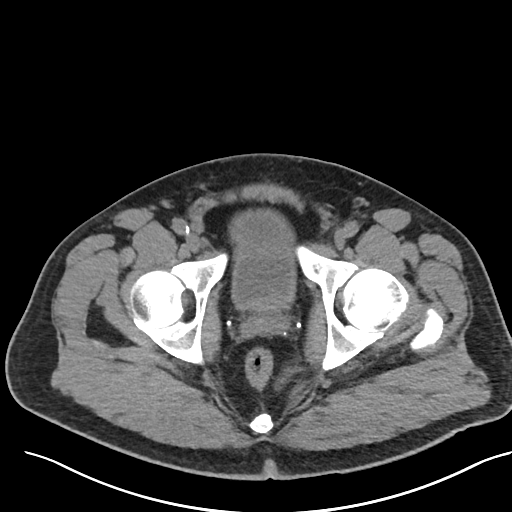
[im 29/101  soft-tissue]
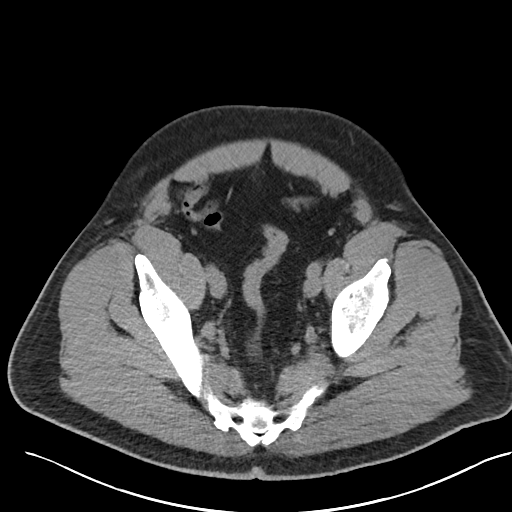
[im 37/101  soft-tissue]
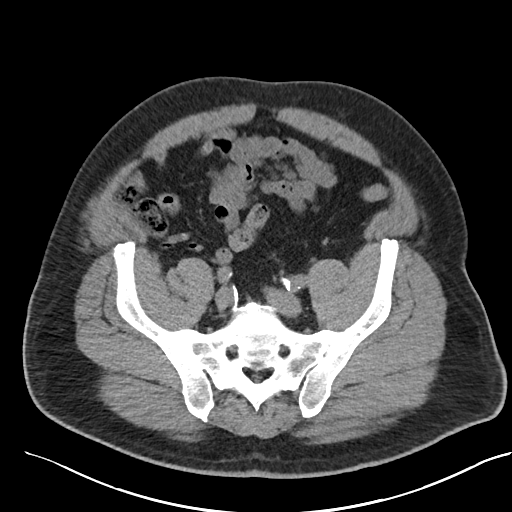
[im 45/101  soft-tissue]
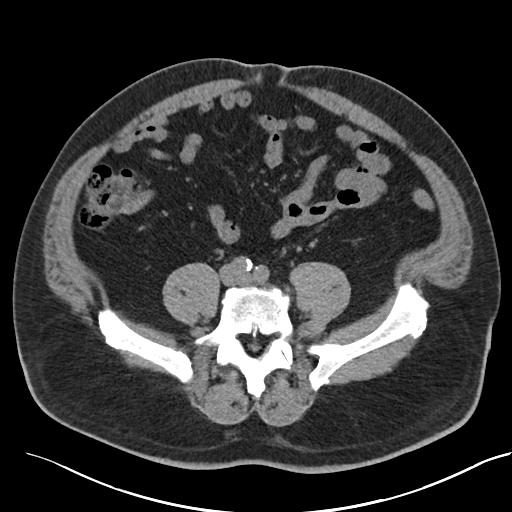
[im 53/101  soft-tissue]
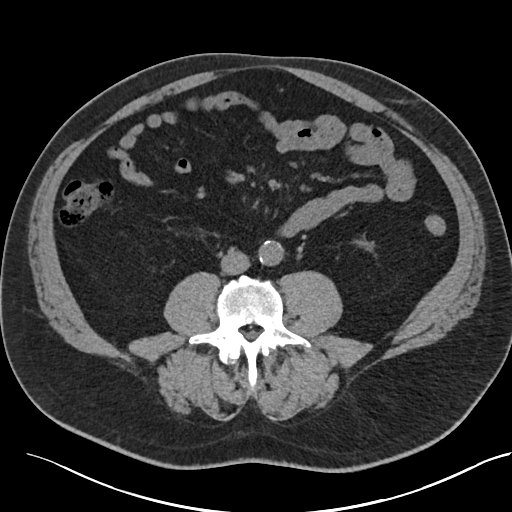
[im 57/101  soft-tissue]
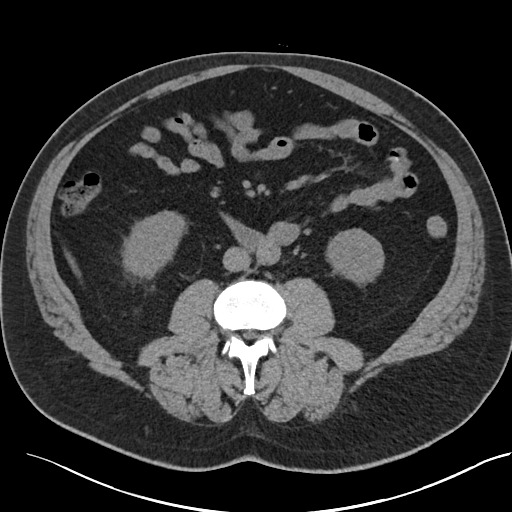
[im 65/101  soft-tissue]
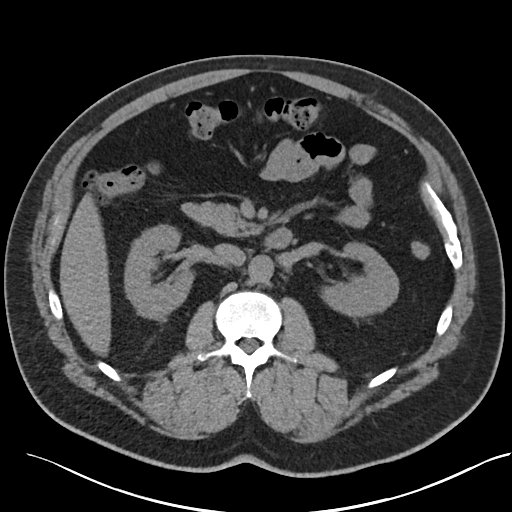
[im 65/101  bone]
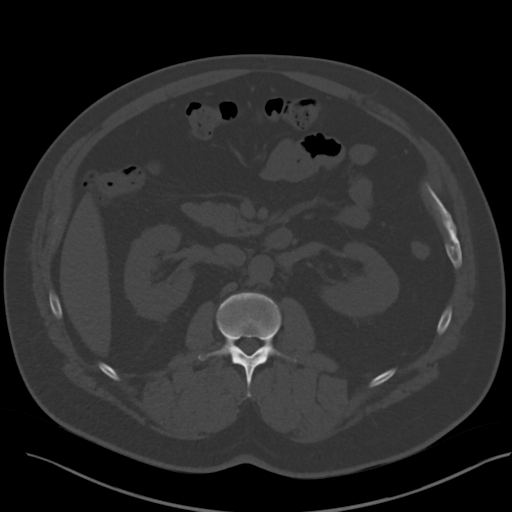
[im 73/101  soft-tissue]
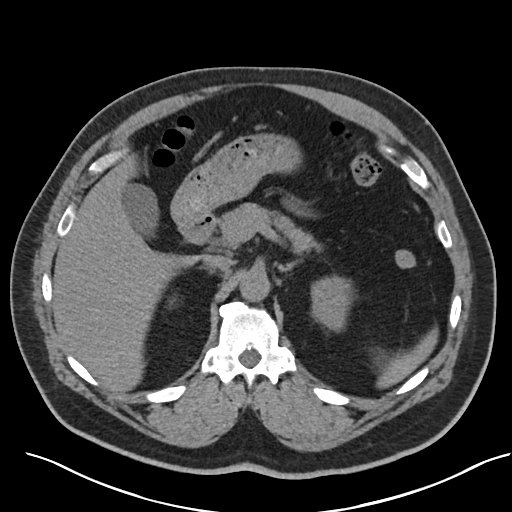
[im 81/101  soft-tissue]
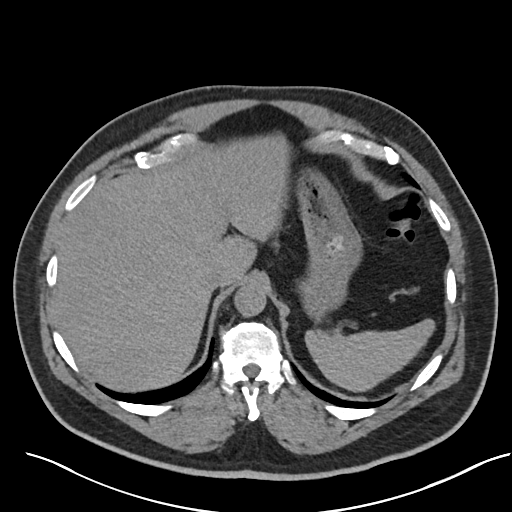
[im 89/101  soft-tissue]
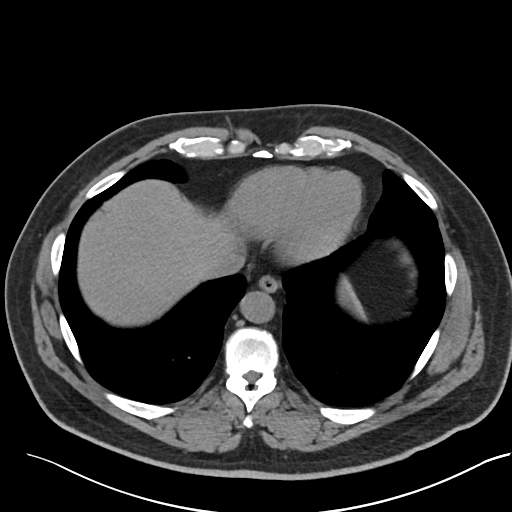
[im 97/101  soft-tissue]
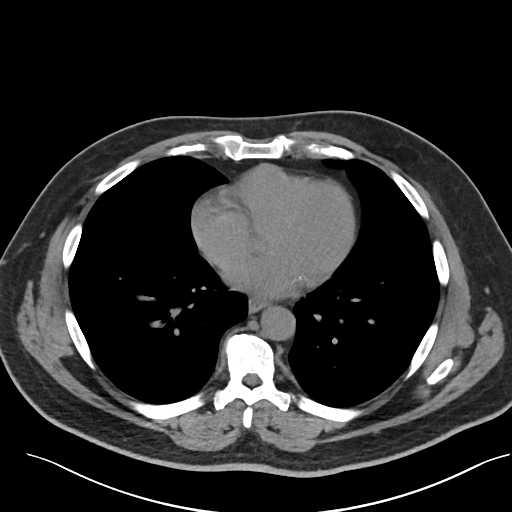

[Series 6: renal stone 2.00 br40 s3 cor · coronal · 0.80mm/px · 3 of 204 slices shown]
[im 68/204  soft-tissue]
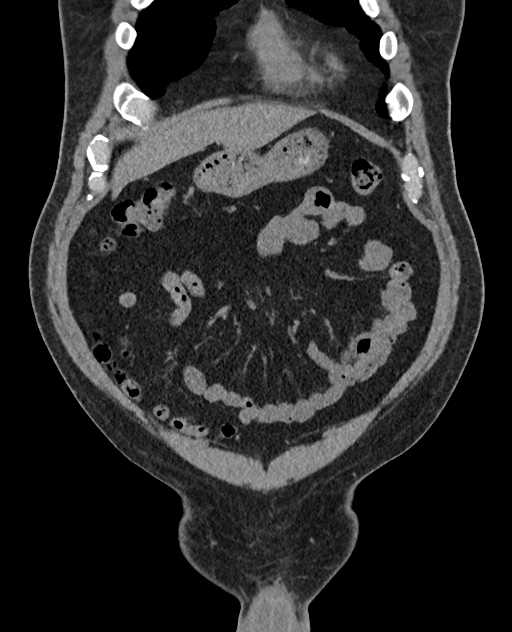
[im 91/204  soft-tissue]
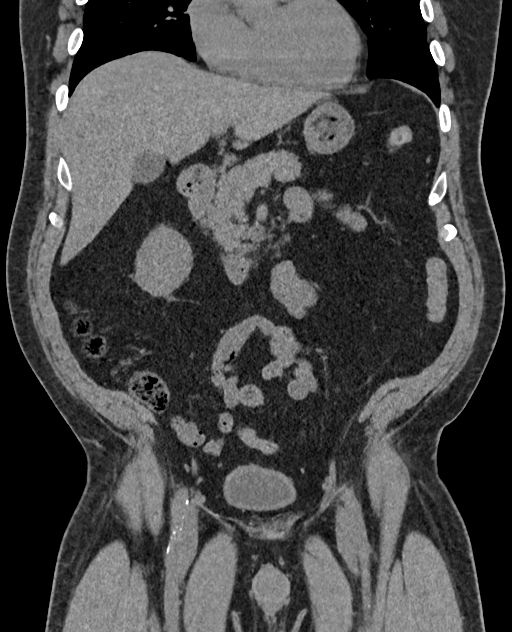
[im 113/204  soft-tissue]
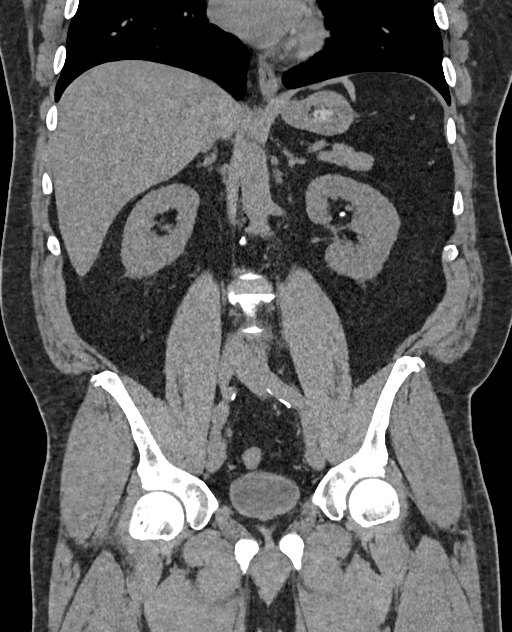

[16 of 46 positions shown; findings below may reference images not displayed]

FINDINGS: Lower chest: No pleural or pericardial effusion. Visualized lung
bases clear.

Hepatobiliary: No focal liver abnormality is seen. No gallstones,
gallbladder wall thickening, or biliary dilatation.

Pancreas: Unremarkable. No pancreatic ductal dilatation or
surrounding inflammatory changes.

Spleen: Normal in size without focal abnormality.

Adrenals/Urinary Tract: Adrenal glands unremarkable. 8 mm calculus
in the upper pole left renal collecting system, and a smaller 1 mm
calculus seen best on the coronal. 2 mm calc in the lower pole right
renal collecting system. 3 mm calc, right upper pole. No
hydronephrosis. The urinary bladder is incompletely distended.

Stomach/Bowel: Stomach incompletely distended. Small bowel
decompressed. Normal appendix. The colon is nondilated,
unremarkable.

Vascular/Lymphatic: Scattered aortoiliac atheromatous calcifications
without aneurysm. No abdominal or pelvic adenopathy.

Reproductive: Mild prostate enlargement with central coarse
calcifications.

Other: Bilateral pelvic phleboliths.  No ascites.  No free air.

Musculoskeletal: Small paraumbilical hernia containing only
mesenteric fat. Partially calcified disc protrusion L5-S1. No
fracture or worrisome bone lesion.
IMPRESSION: 1. No acute findings.
2. Bilateral nonobstructive urolithiasis.
3.  Aortic Atherosclerosis ([id number]).

## 2023-10-25 ENCOUNTER — Other Ambulatory Visit: Payer: Self-pay | Admitting: Podiatry

## 2024-01-20 ENCOUNTER — Other Ambulatory Visit: Payer: Self-pay | Admitting: Podiatry

## 2024-07-01 ENCOUNTER — Telehealth: Payer: Self-pay | Admitting: Podiatry

## 2024-07-01 NOTE — Telephone Encounter (Signed)
He would like a refill on meloxicam

## 2024-07-12 ENCOUNTER — Other Ambulatory Visit: Payer: Self-pay | Admitting: Podiatry

## 2024-07-12 MED ORDER — MELOXICAM 15 MG PO TABS: 15.0000 mg | ORAL_TABLET | Freq: Every day | ORAL | 1 refills | Status: AC

## 2024-07-12 NOTE — Progress Notes (Signed)
 PRN chronic intermittent capsulitis foot.-Dr. Janit

## 2024-07-27 ENCOUNTER — Ambulatory Visit: Admitting: Podiatry

## 2024-12-05 ENCOUNTER — Encounter: Payer: Self-pay | Admitting: Family Medicine

## 2024-12-05 ENCOUNTER — Ambulatory Visit: Admitting: Family Medicine

## 2024-12-05 VITALS — BP 138/82 | HR 73 | Temp 98.1°F | Ht 73.0 in | Wt 261.0 lb

## 2024-12-05 DIAGNOSIS — Z23 Encounter for immunization: Secondary | ICD-10-CM | POA: Diagnosis not present

## 2024-12-05 DIAGNOSIS — I10 Essential (primary) hypertension: Secondary | ICD-10-CM | POA: Diagnosis not present

## 2024-12-05 DIAGNOSIS — Z7689 Persons encountering health services in other specified circumstances: Secondary | ICD-10-CM

## 2024-12-05 DIAGNOSIS — E785 Hyperlipidemia, unspecified: Secondary | ICD-10-CM | POA: Insufficient documentation

## 2024-12-05 NOTE — Assessment & Plan Note (Signed)
 Not taking medication. Will order lipid panel and CMP at physical by the end of the year.

## 2024-12-05 NOTE — Assessment & Plan Note (Signed)
 Blood pressure stable. Continue Losartan 50mg  daily. Will obtain CMP at his physical by the end of the year.

## 2024-12-05 NOTE — Patient Instructions (Signed)
-  It was nice to meet you this afternoon and look forward to taking care of you. -Continue prescribed medications.  -Tdap (tetanus) vaccine provided.  -Follow up for a physical before the end of the year and please be fasting.

## 2024-12-05 NOTE — Progress Notes (Signed)
 New Patient Office Visit  Subjective   Patient ID: Andrew Dean, male    DOB: 1966/06/05  Age: 58 y.o. MRN: [id number]  CC:  Chief Complaint  Patient presents with   Establish Care    HPI Andrew Dean presents to establish care with new provider.  Patients previous primary care provider: [facility] with Dr. Charlie Ruth. Last seen over a year ago.   Specialist: None   HTN: Chronic. Patient is taking Losartan 50mg  daily. Does not monitor blood pressure at home. Denies CP, SHOB, HA, dizziness, lightheadedness, or lower extremity extremity.   Hyperlipidemia: Chronic. Patient was taking Atorvastatin 20mg  daily. Has not took medication in about a year. Just stopping taking medication, no reason   He has seen Dr. Thresa Sar with [facility] for capsulitis of left and right foot. No follow up appointments scheduled.   Outpatient Encounter Medications as of 12/05/2024  Medication Sig   losartan (COZAAR) 50 MG tablet Take 50 mg by mouth daily.   meloxicam  (MOBIC ) 15 MG tablet Take 1 tablet (15 mg total) by mouth daily.   atorvastatin (LIPITOR) 20 MG tablet Take 20 mg by mouth daily. (Patient not taking: Reported on 12/05/2024)   [DISCONTINUED] VIAGRA 100 MG tablet TK 1/2 TO 1 T PO 1 HOUR PRIOR TO SEXUAL ACTIVITY (Patient not taking: Reported on 12/05/2024)   No facility-administered encounter medications on file as of 12/05/2024.    Past Medical History:  Diagnosis Date   High cholesterol    Hypertension    Hypertension    Kidney stones    Vision abnormalities     Past Surgical History:  Procedure Laterality Date   EXTRACORPOREAL SHOCK WAVE LITHOTRIPSY Left 01/05/2023   Procedure: LEFT EXTRACORPOREAL SHOCK WAVE LITHOTRIPSY (ESWL);  Surgeon: Cam Morene ORN, MD;  Location: [facility];  Service: Urology;  Laterality: Left;   FINGER AMPUTATION     middle amputation   HERNIA REPAIR     SKIN  GRAFT      Family History  Problem Relation Age of Onset   Pancreatic cancer Mother    Cancer Mother    Hypertension Father    Diabetes type II Father    Diabetes Father    Healthy Sister    Healthy Sister    Healthy Sister    Colon cancer Neg Hx     Social History   Socioeconomic History   Marital status: Married    Spouse name: Not on file   Number of children: 0   Years of education: Not on file   Highest education level: Associate degree: academic program  Occupational History   Not on file  Tobacco Use   Smoking status: Never    Passive exposure: Never   Smokeless tobacco: Never  Vaping Use   Vaping status: Never Used  Substance and Sexual Activity   Alcohol use: Yes    Alcohol/week: 7.0 standard drinks of alcohol    Types: 7 Shots of liquor per week   Drug use: Never   Sexual activity: Yes  Other Topics Concern   Not on file  Social History Narrative   Not on file   Social Drivers of Health   Tobacco Use: Low Risk (12/05/2024)   Patient History    Smoking Tobacco Use: Never    Smokeless Tobacco Use: Never    Passive Exposure: Never  Financial Resource Strain: Low Risk (12/02/2024)   Overall Financial Resource Strain (CARDIA)  Difficulty of Paying Living Expenses: Not very hard  Food Insecurity: No Food Insecurity (12/02/2024)   Epic    Worried About Programme Researcher, Broadcasting/film/video in the Last Year: Never true    Ran Out of Food in the Last Year: Never true  Transportation Needs: No Transportation Needs (12/02/2024)   Epic    Lack of Transportation (Medical): No    Lack of Transportation (Non-Medical): No  Physical Activity: Sufficiently Active (12/02/2024)   Exercise Vital Sign    Days of Exercise per Week: 5 days    Minutes of Exercise per Session: 90 min  Stress: Stress Concern Present (12/02/2024)   [employer] of Occupational Health - Occupational Stress Questionnaire    Feeling of Stress: Very much  Social Connections: Socially Integrated  (12/02/2024)   Social Connection and Isolation Panel    Frequency of Communication with Friends and Family: More than three times a week    Frequency of Social Gatherings with Friends and Family: Once a week    Attends Religious Services: More than 4 times per year    Active Member of Clubs or Organizations: Yes    Attends Banker Meetings: More than 4 times per year    Marital Status: Married  Catering Manager Violence: Not At Risk (12/05/2024)   Epic    Fear of Current or Ex-Partner: No    Emotionally Abused: No    Physically Abused: No    Sexually Abused: No  Depression (PHQ2-9): Low Risk (12/05/2024)   Depression (PHQ2-9)    PHQ-2 Score: 0  Alcohol Screen: Low Risk (12/02/2024)   Alcohol Screen    Last Alcohol Screening Score (AUDIT): 4  Housing: Low Risk (12/02/2024)   Epic    Unable to Pay for Housing in the Last Year: No    Number of Times Moved in the Last Year: 0    Homeless in the Last Year: No  Utilities: Not At Risk (12/05/2024)   Epic    Threatened with loss of utilities: No  Health Literacy: Adequate Health Literacy (12/05/2024)   B1300 Health Literacy    Frequency of need for help with medical instructions: Never    ROS See HPI above    Objective  BP 138/82   Pulse 73   Temp 98.1 F (36.7 C) (Oral)   Ht 6' 1 (1.854 m)   Wt 261 lb (118.4 kg)   SpO2 99%   BMI 34.43 kg/m   Physical Exam Vitals reviewed.  Constitutional:      General: He is not in acute distress.    Appearance: Normal appearance. He is obese. He is not ill-appearing, toxic-appearing or diaphoretic.  HENT:     Head: Normocephalic and atraumatic.  Eyes:     General:        Right eye: No discharge.        Left eye: No discharge.     Conjunctiva/sclera: Conjunctivae normal.  Cardiovascular:     Rate and Rhythm: Normal rate and regular rhythm.     Heart sounds: Normal heart sounds. No murmur heard.    No friction rub. No gallop.  Pulmonary:     Effort: Pulmonary  effort is normal. No respiratory distress.     Breath sounds: Normal breath sounds.  Musculoskeletal:        General: Normal range of motion.  Skin:    General: Skin is warm and dry.  Neurological:     General: No focal deficit present.  Mental Status: He is alert and oriented to person, place, and time. Mental status is at baseline.  Psychiatric:        Mood and Affect: Mood normal.        Behavior: Behavior normal.        Thought Content: Thought content normal.        Judgment: Judgment normal.      Assessment & Plan:  Primary hypertension Assessment & Plan: Blood pressure stable. Continue Losartan 50mg  daily. Will obtain CMP at his physical by the end of the year.    Hyperlipidemia, unspecified hyperlipidemia type Assessment & Plan: Not taking medication. Will order lipid panel and CMP at physical by the end of the year.    Immunization due -     Tdap vaccine greater than or equal to 7yo IM  Encounter to establish care  1.Reviewed health maintenance  -Influenza vaccine: Declines  -Covid booster: Declines  -HIV and Hep C screening: Order at physical.  -Tdap vaccine: Administered -PNA vaccine: Declined  -Zoster vaccine: Declined  Return for physical-before the end of the year .   Johathan Province, NP

## 2024-12-19 ENCOUNTER — Encounter: Payer: Self-pay | Admitting: Family Medicine

## 2024-12-19 ENCOUNTER — Ambulatory Visit: Admitting: Family Medicine

## 2024-12-19 VITALS — BP 110/74 | HR 61 | Temp 98.0°F | Ht 73.0 in | Wt 261.0 lb

## 2024-12-19 DIAGNOSIS — I10 Essential (primary) hypertension: Secondary | ICD-10-CM | POA: Diagnosis not present

## 2024-12-19 DIAGNOSIS — Z23 Encounter for immunization: Secondary | ICD-10-CM

## 2024-12-19 DIAGNOSIS — L608 Other nail disorders: Secondary | ICD-10-CM

## 2024-12-19 DIAGNOSIS — E785 Hyperlipidemia, unspecified: Secondary | ICD-10-CM

## 2024-12-19 DIAGNOSIS — Z0000 Encounter for general adult medical examination without abnormal findings: Secondary | ICD-10-CM

## 2024-12-19 DIAGNOSIS — Z125 Encounter for screening for malignant neoplasm of prostate: Secondary | ICD-10-CM

## 2024-12-19 DIAGNOSIS — L602 Onychogryphosis: Secondary | ICD-10-CM | POA: Diagnosis not present

## 2024-12-19 DIAGNOSIS — Z1159 Encounter for screening for other viral diseases: Secondary | ICD-10-CM

## 2024-12-19 DIAGNOSIS — Z114 Encounter for screening for human immunodeficiency virus [HIV]: Secondary | ICD-10-CM

## 2024-12-19 LAB — LIPID PANEL
Cholesterol: 239 mg/dL — ABNORMAL HIGH (ref 28–200)
HDL: 55.6 mg/dL
LDL Cholesterol: 164 mg/dL — ABNORMAL HIGH (ref 10–99)
NonHDL: 183.01
Total CHOL/HDL Ratio: 4
Triglycerides: 93 mg/dL (ref 10.0–149.0)
VLDL: 18.6 mg/dL (ref 0.0–40.0)

## 2024-12-19 LAB — CBC WITH DIFFERENTIAL/PLATELET
Basophils Absolute: 0 K/uL (ref 0.0–0.1)
Basophils Relative: 0.5 % (ref 0.0–3.0)
Eosinophils Absolute: 0.1 K/uL (ref 0.0–0.7)
Eosinophils Relative: 2.2 % (ref 0.0–5.0)
HCT: 45.7 % (ref 39.0–52.0)
Hemoglobin: 15.7 g/dL (ref 13.0–17.0)
Lymphocytes Relative: 30 % (ref 12.0–46.0)
Lymphs Abs: 1.7 K/uL (ref 0.7–4.0)
MCHC: 34.4 g/dL (ref 30.0–36.0)
MCV: 80.1 fl (ref 78.0–100.0)
Monocytes Absolute: 0.5 K/uL (ref 0.1–1.0)
Monocytes Relative: 8.2 % (ref 3.0–12.0)
Neutro Abs: 3.3 K/uL (ref 1.4–7.7)
Neutrophils Relative %: 59.1 % (ref 43.0–77.0)
Platelets: 204 K/uL (ref 150.0–400.0)
RBC: 5.7 Mil/uL (ref 4.22–5.81)
RDW: 13.9 % (ref 11.5–15.5)
WBC: 5.6 K/uL (ref 4.0–10.5)

## 2024-12-19 LAB — COMPREHENSIVE METABOLIC PANEL WITH GFR
ALT: 27 U/L (ref 3–53)
AST: 22 U/L (ref 5–37)
Albumin: 4.6 g/dL (ref 3.5–5.2)
Alkaline Phosphatase: 44 U/L (ref 39–117)
BUN: 15 mg/dL (ref 6–23)
CO2: 30 meq/L (ref 19–32)
Calcium: 9 mg/dL (ref 8.4–10.5)
Chloride: 99 meq/L (ref 96–112)
Creatinine, Ser: 0.95 mg/dL (ref 0.40–1.50)
GFR: 86.74 mL/min
Glucose, Bld: 107 mg/dL — ABNORMAL HIGH (ref 70–99)
Potassium: 4 meq/L (ref 3.5–5.1)
Sodium: 138 meq/L (ref 135–145)
Total Bilirubin: 0.8 mg/dL (ref 0.2–1.2)
Total Protein: 7.5 g/dL (ref 6.0–8.3)

## 2024-12-19 LAB — PSA: PSA: 1.12 ng/mL (ref 0.10–4.00)

## 2024-12-19 LAB — TSH: TSH: 1.94 u[IU]/mL (ref 0.35–5.50)

## 2024-12-19 LAB — HEMOGLOBIN A1C: Hgb A1c MFr Bld: 6 % (ref 4.6–6.5)

## 2024-12-19 NOTE — Progress Notes (Signed)
 "  Complete physical exam  Patient: Andrew Dean   DOB: 1966-03-27   58 y.o. Male  MRN: [id number]  Subjective:    Chief Complaint  Patient presents with   Annual Exam    Andrew Dean is a 58 y.o. male who presents today for a complete physical exam. He reports consuming a general diet. Exercise: Walking, push-ups, and sit-ups. He generally feels fairly well. He reports sleeping poorly. He does not have additional problems to discuss today.    Most recent fall risk assessment:    12/19/2024   10:04 AM  Fall Risk   Falls in the past year? 0  Number falls in past yr: 0  Injury with Fall? 0  Risk for fall due to : No Fall Risks  Follow up Falls evaluation completed     Most recent depression screenings:    12/19/2024   10:04 AM 12/05/2024    1:05 PM  PHQ 2/9 Scores  PHQ - 2 Score 0 0  PHQ- 9 Score 0 0    Vision:Not within last year  and Dental: No current dental problems and Receives regular dental care  Past Medical History:  Diagnosis Date   High cholesterol    Hypertension    Hypertension    Kidney stones    Vision abnormalities    Past Surgical History:  Procedure Laterality Date   EXTRACORPOREAL SHOCK WAVE LITHOTRIPSY Left 01/05/2023   Procedure: LEFT EXTRACORPOREAL SHOCK WAVE LITHOTRIPSY (ESWL);  Surgeon: Cam Morene ORN, MD;  [facility];  Service: Urology;  Laterality: Left;   FINGER AMPUTATION     middle amputation   HERNIA REPAIR     SKIN GRAFT     Social History[1] Social History   Socioeconomic History   Marital status: Married    Spouse name: Not on file   Number of children: 0   Years of education: Not on file   Highest education level: Associate degree: academic program  Occupational History   Not on file  Tobacco Use   Smoking status: Never    Passive exposure: Never   Smokeless tobacco: Never  Vaping Use   Vaping status: Never Used  Substance and Sexual Activity   Alcohol use: Yes    Alcohol/week:  7.0 standard drinks of alcohol    Types: 7 Shots of liquor per week   Drug use: Never   Sexual activity: Yes  Other Topics Concern   Not on file  Social History Narrative   Not on file   Social Drivers of Health   Tobacco Use: Low Risk (12/19/2024)   Patient History    Smoking Tobacco Use: Never    Smokeless Tobacco Use: Never    Passive Exposure: Never  Financial Resource Strain: Low Risk (12/02/2024)   Overall Financial Resource Strain (CARDIA)    Difficulty of Paying Living Expenses: Not very hard  Food Insecurity: No Food Insecurity (12/02/2024)   Epic    Worried About Radiation Protection Practitioner of Food in the Last Year: Never true    Ran Out of Food in the Last Year: Never true  Transportation Needs: No Transportation Needs (12/02/2024)   Epic    Lack of Transportation (Medical): No    Lack of Transportation (Non-Medical): No  Physical Activity: Sufficiently Active (12/02/2024)   Exercise Vital Sign    Days of Exercise per Week: 5 days    Minutes of Exercise per Session: 90 min  Stress: Stress Concern Present (12/02/2024)   [employer] of  Occupational Health - Occupational Stress Questionnaire    Feeling of Stress: Very much  Social Connections: Socially Integrated (12/02/2024)   Social Connection and Isolation Panel    Frequency of Communication with Friends and Family: More than three times a week    Frequency of Social Gatherings with Friends and Family: Once a week    Attends Religious Services: More than 4 times per year    Active Member of Clubs or Organizations: Yes    Attends Banker Meetings: More than 4 times per year    Marital Status: Married  Catering Manager Violence: Not At Risk (12/05/2024)   Epic    Fear of Current or Ex-Partner: No    Emotionally Abused: No    Physically Abused: No    Sexually Abused: No  Depression (PHQ2-9): Low Risk (12/19/2024)   Depression (PHQ2-9)    PHQ-2 Score: 0  Alcohol Screen: Low Risk (12/02/2024)   Alcohol  Screen    Last Alcohol Screening Score (AUDIT): 4  Housing: Low Risk (12/02/2024)   Epic    Unable to Pay for Housing in the Last Year: No    Number of Times Moved in the Last Year: 0    Homeless in the Last Year: No  Utilities: Not At Risk (12/05/2024)   Epic    Threatened with loss of utilities: No  Health Literacy: Adequate Health Literacy (12/05/2024)   B1300 Health Literacy    Frequency of need for help with medical instructions: Never   Family Status  Relation Name Status   Mother  Deceased   Father  Alive   Sister  Alive   Sister  Alive   Sister  Alive   Neg Hx  (Not Specified)  No partnership data on file   Family History  Problem Relation Age of Onset   Pancreatic cancer Mother    Cancer Mother    Hypertension Father    Diabetes type II Father    Diabetes Father    Healthy Sister    Healthy Sister    Healthy Sister    Colon cancer Neg Hx    Allergies[2]   Patient Care Team: Andrew Philippe SAUNDERS, NP as PCP - General (Family Medicine)   Show/hide medication list[3]  ROS See HPI above     Objective:   BP 110/74   Pulse 61   Temp 98 F (36.7 C) (Oral)   Ht 6' 1 (1.854 m)   Wt 261 lb (118.4 kg)   SpO2 97%   BMI 34.43 kg/m  BP Readings from Last 3 Encounters:  12/19/24 110/74  12/05/24 138/82  01/05/23 (!) 143/91   Wt Readings from Last 3 Encounters:  12/19/24 261 lb (118.4 kg)  12/05/24 261 lb (118.4 kg)  01/05/23 252 lb 12.8 oz (114.7 kg)   Physical Exam Vitals reviewed.  Constitutional:      General: He is not in acute distress.    Appearance: Normal appearance. He is obese. He is not ill-appearing, toxic-appearing or diaphoretic.  HENT:     Head: Normocephalic and atraumatic.     Right Ear: Tympanic membrane, ear canal and external ear normal. There is no impacted cerumen.     Left Ear: Tympanic membrane, ear canal and external ear normal. There is no impacted cerumen.     Nose:     Right Sinus: No maxillary sinus tenderness or  frontal sinus tenderness.     Left Sinus: No maxillary sinus tenderness or frontal sinus tenderness.  Mouth/Throat:     Mouth: Mucous membranes are moist.     Pharynx: Oropharynx is clear. Uvula midline. No pharyngeal swelling, oropharyngeal exudate, posterior oropharyngeal erythema or uvula swelling.  Eyes:     General:        Right eye: No discharge.        Left eye: No discharge.     Conjunctiva/sclera: Conjunctivae normal.     Pupils: Pupils are equal, round, and reactive to light.  Neck:     Thyroid : No thyromegaly.  Cardiovascular:     Rate and Rhythm: Normal rate and regular rhythm.     Pulses:          Dorsalis pedis pulses are 2+ on the right side and 2+ on the left side.     Heart sounds: Normal heart sounds. No murmur heard.    No friction rub. No gallop.  Pulmonary:     Effort: Pulmonary effort is normal. No respiratory distress.     Breath sounds: Normal breath sounds.  Abdominal:     General: Abdomen is flat. Bowel sounds are normal. There is no distension.     Palpations: Abdomen is soft. There is no mass.     Tenderness: There is no abdominal tenderness. There is no right CVA tenderness or left CVA tenderness.  Musculoskeletal:        General: Normal range of motion.     Cervical back: Normal range of motion.     Right lower leg: No edema.     Left lower leg: No edema.  Lymphadenopathy:     Head:     Right side of head: No submental or submandibular adenopathy.     Left side of head: No submental or submandibular adenopathy.     Cervical: No cervical adenopathy.  Skin:    General: Skin is warm and dry.     Comments: Right big toe: Thick, discolored nail that will come off and has been occurring for years. See picture included.   Neurological:     General: No focal deficit present.     Mental Status: He is alert and oriented to person, place, and time. Mental status is at baseline.     Motor: No weakness.     Gait: Gait normal.  Psychiatric:        Mood  and Affect: Mood normal.        Behavior: Behavior normal.        Thought Content: Thought content normal.        Judgment: Judgment normal.        Assessment & Plan:    Routine Health Maintenance and Physical Exam  Immunization History  Administered Date(s) Administered   Influenza, Quadrivalent, Recombinant, Inj, Pf 11/04/2018   Influenza, Seasonal, Injecte, Preservative Fre 12/19/2024   Tdap 12/05/2024    Health Maintenance  Topic Date Due   HIV Screening  Never done   Hepatitis C Screening  Never done   Pneumococcal Vaccine: 50+ Years (1 of 1 - PCV) Never done   Zoster Vaccines- Shingrix (1 of 2) Never done   COVID-19 Vaccine (1 - 2025-26 season) Never done   Colonoscopy  11/26/2025   DTaP/Tdap/Td (2 - Td or Tdap) 12/05/2034   Influenza Vaccine  Completed   Hepatitis B Vaccines 19-59 Average Risk  Aged Out   HPV VACCINES  Aged Out   Meningococcal B Vaccine  Aged Out    Discussed health benefits of physical activity, and encouraged him to engage  in regular exercise appropriate for his age and condition.  Annual physical exam -     CBC with Differential/Platelet -     Comprehensive metabolic panel with GFR -     Lipid panel -     Hemoglobin A1c -     TSH  Prostate cancer screening -     PSA  Need for hepatitis C screening test -     Hepatitis C antibody  Encounter for screening for HIV -     HIV Antibody (routine testing w rflx)  Hyperlipidemia, unspecified hyperlipidemia type -     Comprehensive metabolic panel with GFR -     Lipid panel  Primary hypertension -     Comprehensive metabolic panel with GFR  Discolored nails -     Ambulatory referral to Podiatry  Thick nail -     Ambulatory referral to Podiatry  Immunization due -     Flu vaccine trivalent PF, 6mos and older(Flulaval,Afluria,Fluarix,Fluzone)  1.Review health maintenance:  -Influenza vaccine: Administered  -Covid booster: Declines  -HIV and Hep C screening: Order at physical.  -PNA  vaccine: Declined  -Zoster vaccine: Will come back for a nurse to obtain. Discussed about it.  2.Physical exam completed today.  3.Recommend to work on a healthy diet and continue with regular exercise. 4.Continue all medications. He reports he has not been taking Atorvastatin as prescribed for no particular reason. 5.Ordered labs. Office will call with lab results and will be available via MyChart.  6.Placed a referral to podiatry about discolored and thick nail of the right big toe. Suspect it could be fungal related. Uncertain for reason of toenail coming off intermittently.    Return in about 6 months (around 06/19/2025) for chronic management.    Andrew Mattioli, NP     [1]  Social History Tobacco Use   Smoking status: Never    Passive exposure: Never   Smokeless tobacco: Never  Vaping Use   Vaping status: Never Used  Substance Use Topics   Alcohol use: Yes    Alcohol/week: 7.0 standard drinks of alcohol    Types: 7 Shots of liquor per week   Drug use: Never  [2] No Known Allergies [3]  Outpatient Medications Prior to Visit  Medication Sig   atorvastatin (LIPITOR) 20 MG tablet Take 20 mg by mouth daily.   losartan (COZAAR) 50 MG tablet Take 50 mg by mouth daily.   meloxicam  (MOBIC ) 15 MG tablet Take 1 tablet (15 mg total) by mouth daily.   No facility-administered medications prior to visit.   "

## 2024-12-19 NOTE — Patient Instructions (Addendum)
-  It was a pleasure to care for you today.  -Physical exam completed today.  -Recommend to work on a healthy diet and continue with regular exercise. -Continue all medications.  -Ordered labs. Office will call with lab results and will be available via MyChart.  -Influenza vaccine provided. -Discussed about shingles vaccine, may make a nurse visit to have first vaccine. Would need second vaccine, 2-6 months from first vaccine. -Placed a referral to podiatry about discolored and thick nail of the right big toe. Please call the office or send a MyChart message if you do not receive a phone call or a MyChart message about appointment in 2 weeks.  -Follow up in 6 months.

## 2024-12-20 ENCOUNTER — Ambulatory Visit: Payer: Self-pay | Admitting: Family Medicine

## 2024-12-20 LAB — HIV ANTIBODY (ROUTINE TESTING W REFLEX)
HIV 1&2 Ab, 4th Generation: NONREACTIVE
HIV FINAL INTERPRETATION: NEGATIVE

## 2024-12-20 LAB — HEPATITIS C ANTIBODY: Hepatitis C Ab: NONREACTIVE

## 2025-06-19 ENCOUNTER — Ambulatory Visit: Admitting: Family Medicine
